# Patient Record
Sex: Male | Born: 1945
Health system: Southern US, Community
[De-identification: ages and names within clinical notes are randomized; demographics above are authoritative.]

## PROBLEM LIST (undated history)

## (undated) DIAGNOSIS — C439 Malignant melanoma of skin, unspecified: Secondary | ICD-10-CM

## (undated) DIAGNOSIS — C4491 Basal cell carcinoma of skin, unspecified: Secondary | ICD-10-CM

## (undated) DIAGNOSIS — T7840XA Allergy, unspecified, initial encounter: Secondary | ICD-10-CM

## (undated) DIAGNOSIS — E236 Other disorders of pituitary gland: Secondary | ICD-10-CM

## (undated) DIAGNOSIS — D126 Benign neoplasm of colon, unspecified: Secondary | ICD-10-CM

## (undated) DIAGNOSIS — M8580 Other specified disorders of bone density and structure, unspecified site: Secondary | ICD-10-CM

## (undated) DIAGNOSIS — E785 Hyperlipidemia, unspecified: Secondary | ICD-10-CM

## (undated) DIAGNOSIS — R002 Palpitations: Secondary | ICD-10-CM

## (undated) HISTORY — DX: Malignant melanoma of skin, unspecified: C43.9

## (undated) HISTORY — PX: POLYPECTOMY: SHX149

## (undated) HISTORY — DX: Allergy, unspecified, initial encounter: T78.40XA

## (undated) HISTORY — DX: Benign neoplasm of colon, unspecified: D12.6

## (undated) HISTORY — DX: Other specified disorders of bone density and structure, unspecified site: M85.80

## (undated) HISTORY — DX: Hyperlipidemia, unspecified: E78.5

## (undated) HISTORY — PX: COLONOSCOPY: SHX174

## (undated) HISTORY — DX: Other disorders of pituitary gland: E23.6

## (undated) HISTORY — DX: Palpitations: R00.2

## (undated) HISTORY — DX: Basal cell carcinoma of skin, unspecified: C44.91

---
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
## 1994-09-01 HISTORY — PX: MOHS SURGERY: SUR867

## 1994-09-01 HISTORY — PX: FACIAL RECONSTRUCTION SURGERY: SHX631

## 1998-09-01 DIAGNOSIS — M8580 Other specified disorders of bone density and structure, unspecified site: Secondary | ICD-10-CM

## 1998-09-01 HISTORY — DX: Other specified disorders of bone density and structure, unspecified site: M85.80

## 1999-07-28 ENCOUNTER — Emergency Department (HOSPITAL_COMMUNITY): Admission: EM | Admit: 1999-07-28 | Discharge: 1999-07-28 | Payer: Self-pay | Admitting: Emergency Medicine

## 2004-03-11 ENCOUNTER — Encounter: Payer: Self-pay | Admitting: Gastroenterology

## 2004-04-12 ENCOUNTER — Ambulatory Visit (HOSPITAL_COMMUNITY): Admission: RE | Admit: 2004-04-12 | Discharge: 2004-04-12 | Payer: Self-pay | Admitting: Gastroenterology

## 2005-02-19 ENCOUNTER — Emergency Department (HOSPITAL_COMMUNITY): Admission: EM | Admit: 2005-02-19 | Discharge: 2005-02-19 | Payer: Self-pay | Admitting: Emergency Medicine

## 2005-05-15 ENCOUNTER — Ambulatory Visit: Payer: Self-pay | Admitting: Gastroenterology

## 2006-07-14 ENCOUNTER — Ambulatory Visit: Payer: Self-pay | Admitting: Gastroenterology

## 2007-06-02 ENCOUNTER — Encounter: Admission: RE | Admit: 2007-06-02 | Discharge: 2007-06-02 | Payer: Self-pay | Admitting: Internal Medicine

## 2007-08-10 ENCOUNTER — Ambulatory Visit: Payer: Self-pay | Admitting: Gastroenterology

## 2008-01-06 ENCOUNTER — Ambulatory Visit: Payer: Self-pay | Admitting: Gastroenterology

## 2008-01-17 ENCOUNTER — Telehealth: Payer: Self-pay | Admitting: Gastroenterology

## 2008-02-17 ENCOUNTER — Encounter: Payer: Self-pay | Admitting: Gastroenterology

## 2008-03-31 ENCOUNTER — Telehealth: Payer: Self-pay | Admitting: Gastroenterology

## 2009-03-08 DIAGNOSIS — E782 Mixed hyperlipidemia: Secondary | ICD-10-CM | POA: Insufficient documentation

## 2009-03-08 DIAGNOSIS — K219 Gastro-esophageal reflux disease without esophagitis: Secondary | ICD-10-CM | POA: Insufficient documentation

## 2009-03-08 DIAGNOSIS — K648 Other hemorrhoids: Secondary | ICD-10-CM | POA: Insufficient documentation

## 2009-03-08 DIAGNOSIS — K222 Esophageal obstruction: Secondary | ICD-10-CM | POA: Insufficient documentation

## 2009-03-13 ENCOUNTER — Ambulatory Visit: Payer: Self-pay | Admitting: Gastroenterology

## 2009-03-15 LAB — CONVERTED CEMR LAB
Folate: >20 ng/mL
Iron: 102 ug/dL (ref 42–165)
Saturation Ratios: 28.2 % (ref 20.0–50.0)
Transferrin: 258.2 mg/dL (ref 212.0–360.0)

## 2009-03-19 ENCOUNTER — Telehealth: Payer: Self-pay | Admitting: Gastroenterology

## 2010-03-26 ENCOUNTER — Encounter: Admission: RE | Admit: 2010-03-26 | Discharge: 2010-03-26 | Payer: Self-pay | Admitting: Gastroenterology

## 2011-01-14 NOTE — Assessment & Plan Note (Signed)
[facility]                         GASTROENTEROLOGY OFFICE NOTE   DEMONTE, DOBRATZ                        MRN:          [id number]  DATE:01/06/2008                            DOB:          Sep 24, 1945    Paul King is doing well as long as he takes twice-a-day PPI.  He has  had a recurrence of his choking and globus sensation but denies true  dysphagia or true reflux symptoms.   Vital signs were all normal.  General physical exam was not repeated.   RECOMMENDATIONS:  I placed him on Kapidex 60 mg a day and we will see  how he does symptomatically on this medication, which should give him  longer, better acid control.  In addition, he is to continue his reflux  regime as previously outlined.     Paul Rea. Jarold Motto, MD, Caleen Essex, FAGA  Electronically Signed    DRP/MedQ  DD: 01/06/2008  DT: 01/06/2008  Job #: [id number]

## 2011-02-25 ENCOUNTER — Other Ambulatory Visit: Payer: Self-pay | Admitting: Internal Medicine

## 2011-02-25 DIAGNOSIS — N63 Unspecified lump in unspecified breast: Secondary | ICD-10-CM

## 2011-02-26 ENCOUNTER — Other Ambulatory Visit: Payer: Self-pay | Admitting: Internal Medicine

## 2011-02-26 ENCOUNTER — Ambulatory Visit
Admission: RE | Admit: 2011-02-26 | Discharge: 2011-02-26 | Disposition: A | Discharge status: Home / Self Care | Payer: BC Managed Care – PPO | Source: Ambulatory Visit | Attending: Internal Medicine | Admitting: Internal Medicine

## 2011-02-26 DIAGNOSIS — N63 Unspecified lump in unspecified breast: Secondary | ICD-10-CM

## 2011-04-23 HISTORY — PX: CARDIOVASCULAR STRESS TEST: SHX262

## 2011-09-09 DIAGNOSIS — L570 Actinic keratosis: Secondary | ICD-10-CM | POA: Diagnosis not present

## 2011-09-09 DIAGNOSIS — D239 Other benign neoplasm of skin, unspecified: Secondary | ICD-10-CM | POA: Diagnosis not present

## 2011-09-09 DIAGNOSIS — D485 Neoplasm of uncertain behavior of skin: Secondary | ICD-10-CM | POA: Diagnosis not present

## 2011-11-17 ENCOUNTER — Other Ambulatory Visit: Payer: Self-pay | Admitting: Internal Medicine

## 2011-11-17 DIAGNOSIS — M810 Age-related osteoporosis without current pathological fracture: Secondary | ICD-10-CM | POA: Diagnosis not present

## 2011-11-17 DIAGNOSIS — E229 Hyperfunction of pituitary gland, unspecified: Secondary | ICD-10-CM | POA: Diagnosis not present

## 2012-02-05 ENCOUNTER — Ambulatory Visit (INDEPENDENT_AMBULATORY_CARE_PROVIDER_SITE_OTHER): Payer: Medicare Other | Admitting: Internal Medicine

## 2012-02-05 VITALS — BP 125/73 | HR 71 | Temp 97.7°F | Resp 16 | Ht 71.5 in | Wt 165.8 lb

## 2012-02-05 DIAGNOSIS — L299 Pruritus, unspecified: Secondary | ICD-10-CM | POA: Diagnosis not present

## 2012-02-05 DIAGNOSIS — T148 Other injury of unspecified body region: Secondary | ICD-10-CM | POA: Diagnosis not present

## 2012-02-05 DIAGNOSIS — M810 Age-related osteoporosis without current pathological fracture: Secondary | ICD-10-CM | POA: Diagnosis not present

## 2012-02-05 DIAGNOSIS — W57XXXA Bitten or stung by nonvenomous insect and other nonvenomous arthropods, initial encounter: Secondary | ICD-10-CM

## 2012-02-05 MED ORDER — MUPIROCIN 2 % EX OINT: TOPICAL_OINTMENT | Freq: Three times a day (TID) | CUTANEOUS | Status: AC

## 2012-02-05 MED ORDER — CLOBETASOL PROPIONATE 0.05 % EX CREA: TOPICAL_CREAM | Freq: Two times a day (BID) | CUTANEOUS | Status: AC

## 2012-02-05 NOTE — Patient Instructions (Signed)
Spider Bite  Spider bites are not common. Most spider bites do not cause serious problems. The elderly, very young children, and people with certain existing medical conditions are more likely to experience significant symptoms.  SYMPTOMS   Spider bites may not cause any pain at first. Within 1 or 2 days of the bite, there may be swelling, redness, and pain in the bite area. However, some spider bites can cause pain within the first hour.  TREATMENT   Your caregiver may prescribe antibiotic medicine if a bacterial infection develops in the bite. However, not all spider bites require antibiotics or prescription medicines.   HOME CARE INSTRUCTIONS   Do not scratch the bite area.   Keep the bite area clean and dry. Wash the area with soap and water as directed.   Put ice or cool compresses on the bite area.   Put ice in a plastic bag.   Place a towel between your skin and the bag.   Leave the ice on for 20 minutes, 4 times a day for the first 2 to 3 days, or as directed.   Keep the bite area elevated above the level of your heart. This helps reduce redness and swelling.   Only take over-the-counter or prescription medicines as directed by your caregiver.   If you are given antibiotics, take them as directed. Finish them even if you start to feel better.  You may need a tetanus shot if:   You cannot remember when you had your last tetanus shot.   You have never had a tetanus shot.   The injury broke your skin.  If you get a tetanus shot, your arm may swell, get red, and feel warm to the touch. This is common and not a problem. If you need a tetanus shot and you choose not to have one, there is a rare chance of getting tetanus. Sickness from tetanus can be serious.  SEEK MEDICAL CARE IF:  Your bite is not better after 3 days of treatment.  SEEK IMMEDIATE MEDICAL CARE IF:   Your bite turns purple or develops increased swelling, pain, or redness.   You develop shortness of breath or chest pain.   You have  muscle cramps or painful muscle spasms.   You develop abdominal pain, nausea, or vomiting.   You feel unusually tired or sleepy.  MAKE SURE YOU:   Understand these instructions.   Will watch your condition.   Will get help right away if you are not doing well or get worse.  Document Released: 09/25/2004 Document Revised: 08/07/2011 Document Reviewed: 03/19/2011  Exit[employer] Patient Information 2012 [employer].

## 2012-02-07 NOTE — Progress Notes (Signed)
  Subjective:    Patient ID: Paul King, male    DOB: 1946-03-08, 66 y.o.   MRN: [id number]  HPI    Review of Systems     Objective:   Physical Exam        Assessment & Plan:  Insect bite

## 2012-04-06 ENCOUNTER — Ambulatory Visit
Admission: RE | Admit: 2012-04-06 | Discharge: 2012-04-06 | Disposition: A | Discharge status: Home / Self Care | Payer: Medicare Other | Source: Ambulatory Visit | Attending: Internal Medicine | Admitting: Internal Medicine

## 2012-04-06 DIAGNOSIS — M810 Age-related osteoporosis without current pathological fracture: Secondary | ICD-10-CM | POA: Diagnosis not present

## 2012-04-06 DIAGNOSIS — N401 Enlarged prostate with lower urinary tract symptoms: Secondary | ICD-10-CM | POA: Diagnosis not present

## 2012-04-06 LAB — HM DEXA SCAN

## 2012-04-12 DIAGNOSIS — N401 Enlarged prostate with lower urinary tract symptoms: Secondary | ICD-10-CM | POA: Diagnosis not present

## 2012-05-06 ENCOUNTER — Telehealth: Payer: Self-pay

## 2012-05-06 DIAGNOSIS — Z85820 Personal history of malignant melanoma of skin: Secondary | ICD-10-CM | POA: Diagnosis not present

## 2012-05-06 DIAGNOSIS — L570 Actinic keratosis: Secondary | ICD-10-CM | POA: Diagnosis not present

## 2012-05-06 DIAGNOSIS — I059 Rheumatic mitral valve disease, unspecified: Secondary | ICD-10-CM | POA: Diagnosis not present

## 2012-05-06 DIAGNOSIS — Z85828 Personal history of other malignant neoplasm of skin: Secondary | ICD-10-CM | POA: Diagnosis not present

## 2012-05-06 DIAGNOSIS — L821 Other seborrheic keratosis: Secondary | ICD-10-CM | POA: Diagnosis not present

## 2012-05-06 NOTE — Telephone Encounter (Signed)
Patient called back after checking his records. No recent labs. Will come in for a physical and lab work.

## 2012-05-06 NOTE — Telephone Encounter (Signed)
The patient called to request that his lab work from 2011 and 2012 (that show his cholesterol levels) be faxed to [facility] on [facility], to the attention of  Dr. Royann Shivers.  The patient may be reached at [phone].

## 2012-05-06 NOTE — Telephone Encounter (Signed)
Spoke with patient. Last labs with cholesterol levels were from 2009 and 2010. He states [facility] already has those and he will check his records for more recent labs.

## 2012-05-07 ENCOUNTER — Encounter: Payer: Self-pay | Admitting: Internal Medicine

## 2012-05-17 ENCOUNTER — Ambulatory Visit: Payer: Medicare Other

## 2012-05-17 ENCOUNTER — Ambulatory Visit (INDEPENDENT_AMBULATORY_CARE_PROVIDER_SITE_OTHER): Payer: Medicare Other | Admitting: Internal Medicine

## 2012-05-17 VITALS — BP 120/70 | HR 67 | Temp 98.2°F | Resp 18 | Ht 71.0 in | Wt 165.8 lb

## 2012-05-17 DIAGNOSIS — E221 Hyperprolactinemia: Secondary | ICD-10-CM | POA: Insufficient documentation

## 2012-05-17 DIAGNOSIS — M810 Age-related osteoporosis without current pathological fracture: Secondary | ICD-10-CM

## 2012-05-17 DIAGNOSIS — Z79899 Other long term (current) drug therapy: Secondary | ICD-10-CM

## 2012-05-17 DIAGNOSIS — Z0000 Encounter for general adult medical examination without abnormal findings: Secondary | ICD-10-CM | POA: Diagnosis not present

## 2012-05-17 DIAGNOSIS — M25529 Pain in unspecified elbow: Secondary | ICD-10-CM

## 2012-05-17 DIAGNOSIS — E7800 Pure hypercholesterolemia, unspecified: Secondary | ICD-10-CM

## 2012-05-17 DIAGNOSIS — E229 Hyperfunction of pituitary gland, unspecified: Secondary | ICD-10-CM | POA: Diagnosis not present

## 2012-05-17 LAB — LIPID PANEL
Cholesterol: 238 mg/dL — ABNORMAL HIGH (ref 0–200)
HDL: 39 mg/dL — ABNORMAL LOW (ref >39)
Total CHOL/HDL Ratio: 6.1 Ratio
VLDL: 71 mg/dL — ABNORMAL HIGH (ref 0–40)

## 2012-05-17 LAB — CBC WITH DIFFERENTIAL/PLATELET
Eosinophils Relative: 4 % (ref 0–5)
HCT: 41.9 % (ref 39.0–52.0)
Hemoglobin: 13.9 g/dL (ref 13.0–17.0)
MCH: 28.1 pg (ref 26.0–34.0)
MCHC: 33.2 g/dL (ref 30.0–36.0)
Platelets: 299 10*3/uL (ref 150–400)
RBC: 4.94 MIL/uL (ref 4.22–5.81)
RDW: 14 % (ref 11.5–15.5)

## 2012-05-17 LAB — COMPREHENSIVE METABOLIC PANEL
ALT: 13 U/L (ref 0–53)
CO2: 29 mEq/L (ref 19–32)
Creat: 0.77 mg/dL (ref 0.50–1.35)
Glucose, Bld: 92 mg/dL (ref 70–99)
Total Bilirubin: 0.4 mg/dL (ref 0.3–1.2)

## 2012-05-17 NOTE — Progress Notes (Signed)
  Subjective:    Patient ID: Paul King, male    DOB: 19-Dec-1945, 66 y.o.   MRN: [id number]  HPIHere for annual exam Doing very well Feels so much better on medication for hyperprolactinemia/recent DEXA scan actually showed improvement in bone density   Pain in R elbow with full pronat for 6 months/ok with golf,grip,eating etc Review of Systems 13 point review of systems negative other than above Dr Coituru-Cards Val last month within normal limits   C. Gastroenterology evaluation-all symptoms abated Urology evaluation last month, PSA less than 1 all symptoms normal Followed by endocrinology at [facility] for prolactinemia and doing well Objective:   Physical Exam Vital signs stable HEENT clear Heart regular without murmur Lungs clear Abdomen supple Extremities= good range of motion bowel major joints The right elbow is nontender over the lateral epicondyles but just medial to this as you move into the antecubital fossa is a tender area with the pressure/this can be aggravated by extreme pronation Neurological intact    [facility] reading (PRIMARY) by  Dr.Caylen Kuwahara=no bony abnormality   Assessment & Plan:  Annual examination-very healthy 66 year old 1. Pain in elbow  DG Elbow Complete Right  2. Annual physical exam  CBC with Differential, Comprehensive metabolic panel, Lipid panel  3. Hyperprolactinemia  Comprehensive metabolic panel, Lipid panel  4. Hx of hypercholesterolemia  Lipid panel  5. Medication management -cabergoline CBC with Differential, Comprehensive metabolic panel  6. Osteoporosis Secondary    Routine labs to share with cardiology

## 2012-05-19 ENCOUNTER — Encounter: Payer: Self-pay | Admitting: Internal Medicine

## 2012-05-27 ENCOUNTER — Encounter: Payer: Self-pay | Admitting: *Deleted

## 2012-05-27 DIAGNOSIS — R002 Palpitations: Secondary | ICD-10-CM

## 2012-05-31 ENCOUNTER — Telehealth: Payer: Self-pay

## 2012-05-31 NOTE — Telephone Encounter (Signed)
Never heard results of his arm xray would like a call back it has been 2 weeks [phone]

## 2012-05-31 NOTE — Telephone Encounter (Signed)
Called and left message to advise of xray result. Advised if still painful to return to clinic.

## 2012-06-22 ENCOUNTER — Encounter: Payer: Self-pay | Admitting: Family Medicine

## 2012-06-22 DIAGNOSIS — E229 Hyperfunction of pituitary gland, unspecified: Secondary | ICD-10-CM | POA: Diagnosis not present

## 2012-06-23 ENCOUNTER — Encounter: Payer: Self-pay | Admitting: *Deleted

## 2012-07-05 DIAGNOSIS — I059 Rheumatic mitral valve disease, unspecified: Secondary | ICD-10-CM | POA: Diagnosis not present

## 2012-07-05 HISTORY — PX: US ECHOCARDIOGRAPHY: HXRAD669

## 2012-08-03 ENCOUNTER — Telehealth: Payer: Self-pay

## 2012-08-03 NOTE — Telephone Encounter (Signed)
Pt would like a copy of his x-rays from a month or two ago. Best# [phone]

## 2012-08-03 NOTE — Telephone Encounter (Signed)
Request given to xray 

## 2012-08-05 ENCOUNTER — Telehealth: Payer: Self-pay

## 2012-08-05 NOTE — Telephone Encounter (Signed)
Pt came into walk in clinic to p/u. Xray was not in drawer so Cipriano Mile. Made copy. Gave to pt.  bf

## 2012-08-05 NOTE — Telephone Encounter (Signed)
Pt would like to pick xray up tonight for his appt on Monday he is going out of town please call when ready

## 2012-08-09 DIAGNOSIS — M7710 Lateral epicondylitis, unspecified elbow: Secondary | ICD-10-CM | POA: Diagnosis not present

## 2012-10-07 ENCOUNTER — Encounter: Payer: Self-pay | Admitting: *Deleted

## 2012-10-07 DIAGNOSIS — I341 Nonrheumatic mitral (valve) prolapse: Secondary | ICD-10-CM | POA: Insufficient documentation

## 2012-11-08 DIAGNOSIS — L819 Disorder of pigmentation, unspecified: Secondary | ICD-10-CM | POA: Diagnosis not present

## 2012-11-08 DIAGNOSIS — D485 Neoplasm of uncertain behavior of skin: Secondary | ICD-10-CM | POA: Diagnosis not present

## 2012-11-08 DIAGNOSIS — D1801 Hemangioma of skin and subcutaneous tissue: Secondary | ICD-10-CM | POA: Diagnosis not present

## 2012-11-08 DIAGNOSIS — L821 Other seborrheic keratosis: Secondary | ICD-10-CM | POA: Diagnosis not present

## 2012-11-08 DIAGNOSIS — L570 Actinic keratosis: Secondary | ICD-10-CM | POA: Diagnosis not present

## 2012-11-08 DIAGNOSIS — L851 Acquired keratosis [keratoderma] palmaris et plantaris: Secondary | ICD-10-CM | POA: Diagnosis not present

## 2012-11-08 DIAGNOSIS — Z85828 Personal history of other malignant neoplasm of skin: Secondary | ICD-10-CM | POA: Diagnosis not present

## 2012-12-08 DIAGNOSIS — H5200 Hypermetropia, unspecified eye: Secondary | ICD-10-CM | POA: Diagnosis not present

## 2012-12-08 DIAGNOSIS — H524 Presbyopia: Secondary | ICD-10-CM | POA: Diagnosis not present

## 2012-12-08 DIAGNOSIS — H179 Unspecified corneal scar and opacity: Secondary | ICD-10-CM | POA: Diagnosis not present

## 2013-04-22 ENCOUNTER — Telehealth: Payer: Self-pay | Admitting: Cardiovascular Disease

## 2013-04-22 DIAGNOSIS — Z79899 Other long term (current) drug therapy: Secondary | ICD-10-CM

## 2013-04-22 DIAGNOSIS — R5381 Other malaise: Secondary | ICD-10-CM

## 2013-04-22 DIAGNOSIS — E782 Mixed hyperlipidemia: Secondary | ICD-10-CM

## 2013-04-22 NOTE — Telephone Encounter (Signed)
Please send a lab order out to Paul King before his yearly appointment on 05/26/13 at 8am   Thanks

## 2013-04-22 NOTE — Telephone Encounter (Signed)
Message forwarded to W. Waddell, CMA.  

## 2013-04-22 NOTE — Telephone Encounter (Signed)
Correction. Pt is Dr. Royann Shivers patient.  Forwarded to B. Lassiter, CMA.  Chart# [id number] on desk.

## 2013-04-22 NOTE — Telephone Encounter (Signed)
This is not my patient. Won't know what to order for the MD.

## 2013-04-25 NOTE — Telephone Encounter (Signed)
Lab order sent to patient  

## 2013-05-16 DIAGNOSIS — Z85828 Personal history of other malignant neoplasm of skin: Secondary | ICD-10-CM | POA: Diagnosis not present

## 2013-05-16 DIAGNOSIS — L821 Other seborrheic keratosis: Secondary | ICD-10-CM | POA: Diagnosis not present

## 2013-05-16 DIAGNOSIS — D1801 Hemangioma of skin and subcutaneous tissue: Secondary | ICD-10-CM | POA: Diagnosis not present

## 2013-05-16 DIAGNOSIS — Z85820 Personal history of malignant melanoma of skin: Secondary | ICD-10-CM | POA: Diagnosis not present

## 2013-05-16 DIAGNOSIS — L738 Other specified follicular disorders: Secondary | ICD-10-CM | POA: Diagnosis not present

## 2013-05-16 DIAGNOSIS — D239 Other benign neoplasm of skin, unspecified: Secondary | ICD-10-CM | POA: Diagnosis not present

## 2013-05-16 DIAGNOSIS — L819 Disorder of pigmentation, unspecified: Secondary | ICD-10-CM | POA: Diagnosis not present

## 2013-05-16 DIAGNOSIS — L570 Actinic keratosis: Secondary | ICD-10-CM | POA: Diagnosis not present

## 2013-05-17 ENCOUNTER — Encounter: Payer: Self-pay | Admitting: *Deleted

## 2013-05-17 DIAGNOSIS — E782 Mixed hyperlipidemia: Secondary | ICD-10-CM | POA: Diagnosis not present

## 2013-05-17 DIAGNOSIS — R5381 Other malaise: Secondary | ICD-10-CM | POA: Diagnosis not present

## 2013-05-17 DIAGNOSIS — Z79899 Other long term (current) drug therapy: Secondary | ICD-10-CM | POA: Diagnosis not present

## 2013-05-17 LAB — LIPID PANEL
LDL Cholesterol: 151 mg/dL — ABNORMAL HIGH (ref 0–99)
Total CHOL/HDL Ratio: 5.2 Ratio
VLDL: 36 mg/dL (ref 0–40)

## 2013-05-17 LAB — CBC
HCT: 42.5 % (ref 39.0–52.0)
MCH: 28.1 pg (ref 26.0–34.0)
MCV: 83.7 fL (ref 78.0–100.0)
RBC: 5.08 MIL/uL (ref 4.22–5.81)
RDW: 14.3 % (ref 11.5–15.5)
WBC: 6.5 10*3/uL (ref 4.0–10.5)

## 2013-05-17 LAB — TSH: TSH: 2.273 u[IU]/mL (ref 0.350–4.500)

## 2013-05-17 LAB — COMPREHENSIVE METABOLIC PANEL
Albumin: 4 g/dL (ref 3.5–5.2)
CO2: 30 mEq/L (ref 19–32)
Calcium: 9.4 mg/dL (ref 8.4–10.5)
Potassium: 4.3 mEq/L (ref 3.5–5.3)
Sodium: 139 mEq/L (ref 135–145)
Total Bilirubin: 0.9 mg/dL (ref 0.3–1.2)

## 2013-05-24 ENCOUNTER — Encounter: Payer: Self-pay | Admitting: Cardiovascular Disease

## 2013-05-26 ENCOUNTER — Ambulatory Visit (INDEPENDENT_AMBULATORY_CARE_PROVIDER_SITE_OTHER): Payer: Medicare Other | Admitting: Cardiovascular Disease

## 2013-05-26 ENCOUNTER — Encounter: Payer: Self-pay | Admitting: Cardiovascular Disease

## 2013-05-26 VITALS — BP 140/78 | HR 71 | Resp 20 | Ht 72.0 in | Wt 170.3 lb

## 2013-05-26 DIAGNOSIS — E7800 Pure hypercholesterolemia, unspecified: Secondary | ICD-10-CM

## 2013-05-26 DIAGNOSIS — R002 Palpitations: Secondary | ICD-10-CM

## 2013-05-26 DIAGNOSIS — I059 Rheumatic mitral valve disease, unspecified: Secondary | ICD-10-CM | POA: Diagnosis not present

## 2013-05-26 DIAGNOSIS — E782 Mixed hyperlipidemia: Secondary | ICD-10-CM

## 2013-05-26 DIAGNOSIS — I341 Nonrheumatic mitral (valve) prolapse: Secondary | ICD-10-CM

## 2013-05-26 NOTE — Assessment & Plan Note (Signed)
No clinical evidence of significant mitral insufficiency. Most recent echo showed only mild regurgitation. Unless he develops symptoms of shortness of breath or change in physical exam, echocardiography is only indicated maybe once every 5 years.

## 2013-05-26 NOTE — Assessment & Plan Note (Signed)
His lipid profile shows marked improvement in triglyceride and HDL-C levels. The total cholesterol is still high and unchanged. There is an apparent increase in LDL-C levels, but I believe this is artifactual. The calculated LDL level appears higher, secondary to the drop in triglycerides. While the LDL level of 150 is undesirable, it is still not in the range where pharmacological therapy is justified. I think it is important that he continue with efforts to reduce saturated fat in his diet, stay lean and exercise. At his next appointment I think we should check a NMR lipid profile with a directly measured LDL cholesterol and breakdown of the types of LDL particles. My suspicion is that he may  have a favorable LDL distribution, not withstanding the the elevated triglycerides. He makes it clear again today that he strongly prefers not to take any lipid-lowering pharmaceuticals, and he appears to have some prejudice against statins.

## 2013-05-26 NOTE — Assessment & Plan Note (Signed)
His ecg today shows a single isolated PVC. He is minimally symptomatic from palpitations. No medicines are indicated.

## 2013-05-26 NOTE — Patient Instructions (Addendum)
Have FASTING lab work done prior to your next appointment in one year at [facility] on the first floor.  Your physician recommends that you schedule a follow-up appointment in: ONE YEAR

## 2013-05-26 NOTE — Progress Notes (Signed)
Patient ID: Paul King, male   DOB: July 18, 1946, 67 y.o.   MRN: [id number]     Reason for office visit Mitral valve prolapse, hyperlipidemia, palpitations  Paul King returns for routine followup and feels well. He has just returned from a golfing trip to [place] where he exercised a lot. He played 9 courses of golf and 10 days and on several days played a total of 36 holes.  He never used a cart. He denied any problems with shortness of breath, fatigue or chest discomfort.  Continues to have occasional palpitations, only noticeable when he lies down at night.  His lipid profile shows substantial improvement in triglyceride levels and an improvement in HDL cholesterol. His total cholesterol remains elevated. He generally eats a healthy diet although he does have a passion for cheese which is the major source of saturated fat intake.     Allergies  Allergen Reactions  . Sulfonamide Derivatives     Current Outpatient Prescriptions  Medication Sig Dispense Refill  . aspirin 81 MG tablet Take 81 mg by mouth daily.      . cabergoline (DOSTINEX) 0.5 MG tablet Take 0.25 mg by mouth 2 (two) times a week.      . calcium carbonate 200 MG capsule Take 1,000 mg by mouth daily.      . cholecalciferol (VITAMIN D) 1000 UNITS tablet Take 2,000 Units by mouth daily.       . hydrocortisone butyrate (LUCOID) 0.1 % CREA cream Apply topically 2 (two) times daily as needed.       . Multiple Vitamins-Minerals (MULTIVITAMIN WITH MINERALS) tablet Take 1 tablet by mouth daily.       No current facility-administered medications for this visit.    Past Medical History  Diagnosis Date  . Osteoporosis   . Palpitations   . Hyperlipidemia   . Hypoprolactinemia     Past Surgical History  Procedure Laterality Date  . Tonsillectomy  1951  . Mohs surgery  1996  . Facial reconstruction surgery  1996  . US echocardiography  07/05/2012    mild-mod LA dilation,mild mitral insuff,mild AI  . Cardiovascular  stress test  04/23/2011    negative    Family History  Problem Relation Age of Onset  . Heart failure Mother   . Stroke Mother   . Stroke Father     History   Social History  . Marital Status: Married    Spouse Name: N/A    Number of Children: N/A  . Years of Education: N/A   Occupational History  . Not on file.   Social History Main Topics  . Smoking status: Never Smoker   . Smokeless tobacco: Not on file  . Alcohol Use: Yes     Comment: 4 glasses of wine a week  . Drug Use: No  . Sexual Activity: Not on file   Other Topics Concern  . Not on file   Social History Narrative  . No narrative on file    Review of systems: The patient specifically denies any chest pain at rest or with exertion, dyspnea at rest or with exertion, orthopnea, paroxysmal nocturnal dyspnea, syncope, focal neurological deficits, intermittent claudication, lower extremity edema, unexplained weight gain, cough, hemoptysis or wheezing.  The patient also denies abdominal pain, nausea, vomiting, dysphagia, diarrhea, constipation, polyuria, polydipsia, dysuria, hematuria, frequency, urgency, abnormal bleeding or bruising, fever, chills, unexpected weight changes, mood swings, change in skin or hair texture, change in voice quality, auditory or visual problems, allergic  reactions or rashes, new musculoskeletal complaints other than usual "aches and pains".   PHYSICAL EXAM BP 140/78  Pulse 71  Resp 20  Ht 6' (1.829 m)  Wt 170 lb 4.8 oz (77.248 kg)  BMI 23.09 kg/m2  General: Alert, oriented x3, no distress Head: no evidence of trauma, PERRL, EOMI, no exophtalmos or lid lag, no myxedema, no xanthelasma; normal ears, nose and oropharynx Neck: normal jugular venous pulsations and no hepatojugular reflux; brisk carotid pulses without delay and no carotid bruits Chest: clear to auscultation, no signs of consolidation by percussion or palpation, normal fremitus, symmetrical and full respiratory  excursions Cardiovascular: normal position and quality of the apical impulse, regular rhythm, normal first and second heart sounds, no murmurs, rubs or gallops. He does have a faint apical click that becomes a little more obvious with a Valsalva maneuver, but there is no associated murmur Abdomen: no tenderness or distention, no masses by palpation, no abnormal pulsatility or arterial bruits, normal bowel sounds, no hepatosplenomegaly Extremities: no clubbing, cyanosis or edema; 2+ radial, ulnar and brachial pulses bilaterally; 2+ right femoral, posterior tibial and dorsalis pedis pulses; 2+ left femoral, posterior tibial and dorsalis pedis pulses; no subclavian or femoral bruits Neurological: grossly nonfocal   EKG: Sinus rhythm, a single PVC, otherwise normal  Lipid Panel     Component Value Date/Time   CHOL 232* 05/17/2013 0840   TRIG 178* 05/17/2013 0840   HDL 45 05/17/2013 0840   CHOLHDL 5.2 05/17/2013 0840   VLDL 36 05/17/2013 0840   LDLCALC 151* 05/17/2013 0840    BMET    Component Value Date/Time   NA 139 05/17/2013 0840   K 4.3 05/17/2013 0840   CL 101 05/17/2013 0840   CO2 30 05/17/2013 0840   GLUCOSE 87 05/17/2013 0840   BUN 16 05/17/2013 0840   CREATININE 0.86 05/17/2013 0840   CALCIUM 9.4 05/17/2013 0840     ASSESSMENT AND PLAN HYPERCHOLESTEROLEMIA His lipid profile shows marked improvement in triglyceride and HDL-C levels. The total cholesterol is still high and unchanged. There is an apparent increase in LDL-C levels, but I believe this is artifactual. The calculated LDL level appears higher, secondary to the drop in triglycerides. While the LDL level of 150 is undesirable, it is still not in the range where pharmacological therapy is justified. I think it is important that he continue with efforts to reduce saturated fat in his diet, stay lean and exercise. At his next appointment I think we should check a NMR lipid profile with a directly measured LDL cholesterol and breakdown  of the types of LDL particles. My suspicion is that he may  have a favorable LDL distribution, not withstanding the the elevated triglycerides. He makes it clear again today that he strongly prefers not to take any lipid-lowering pharmaceuticals, and he appears to have some prejudice against statins.  Palpitations His ecg today shows a single isolated PVC. He is minimally symptomatic from palpitations. No medicines are indicated.  MVP (mitral valve prolapse) No clinical evidence of significant mitral insufficiency. Most recent echo showed only mild regurgitation. Unless he develops symptoms of shortness of breath or change in physical exam, echocardiography is only indicated maybe once every 5 years.  Orders Placed This Encounter  Procedures  . NMR Lipoprofile with Lipids  . EKG 12-Lead   No orders of the defined types were placed in this encounter.    Junious Silk, MD, [facility] [phone] office [phone] pager

## 2013-06-23 DIAGNOSIS — M899 Disorder of bone, unspecified: Secondary | ICD-10-CM | POA: Diagnosis not present

## 2013-06-23 DIAGNOSIS — E229 Hyperfunction of pituitary gland, unspecified: Secondary | ICD-10-CM | POA: Diagnosis not present

## 2013-06-23 DIAGNOSIS — Z23 Encounter for immunization: Secondary | ICD-10-CM | POA: Diagnosis not present

## 2013-06-23 DIAGNOSIS — E348 Other specified endocrine disorders: Secondary | ICD-10-CM | POA: Diagnosis not present

## 2013-11-14 DIAGNOSIS — D1801 Hemangioma of skin and subcutaneous tissue: Secondary | ICD-10-CM | POA: Diagnosis not present

## 2013-11-14 DIAGNOSIS — L219 Seborrheic dermatitis, unspecified: Secondary | ICD-10-CM | POA: Diagnosis not present

## 2013-11-14 DIAGNOSIS — Z85828 Personal history of other malignant neoplasm of skin: Secondary | ICD-10-CM | POA: Diagnosis not present

## 2013-11-14 DIAGNOSIS — L821 Other seborrheic keratosis: Secondary | ICD-10-CM | POA: Diagnosis not present

## 2013-11-14 DIAGNOSIS — D239 Other benign neoplasm of skin, unspecified: Secondary | ICD-10-CM | POA: Diagnosis not present

## 2013-11-14 DIAGNOSIS — L570 Actinic keratosis: Secondary | ICD-10-CM | POA: Diagnosis not present

## 2014-04-03 DIAGNOSIS — N401 Enlarged prostate with lower urinary tract symptoms: Secondary | ICD-10-CM | POA: Diagnosis not present

## 2014-04-03 DIAGNOSIS — N139 Obstructive and reflux uropathy, unspecified: Secondary | ICD-10-CM | POA: Diagnosis not present

## 2014-04-03 DIAGNOSIS — Z125 Encounter for screening for malignant neoplasm of prostate: Secondary | ICD-10-CM | POA: Diagnosis not present

## 2014-04-03 DIAGNOSIS — N138 Other obstructive and reflux uropathy: Secondary | ICD-10-CM | POA: Diagnosis not present

## 2014-04-13 DIAGNOSIS — D126 Benign neoplasm of colon, unspecified: Secondary | ICD-10-CM | POA: Diagnosis not present

## 2014-04-13 DIAGNOSIS — K620 Anal polyp: Secondary | ICD-10-CM | POA: Diagnosis not present

## 2014-04-13 DIAGNOSIS — D181 Lymphangioma, any site: Secondary | ICD-10-CM | POA: Diagnosis not present

## 2014-04-13 DIAGNOSIS — Z86010 Personal history of colonic polyps: Secondary | ICD-10-CM | POA: Diagnosis not present

## 2014-05-17 ENCOUNTER — Ambulatory Visit (INDEPENDENT_AMBULATORY_CARE_PROVIDER_SITE_OTHER): Payer: Medicare Other | Admitting: Cardiovascular Disease

## 2014-05-17 ENCOUNTER — Encounter: Payer: Self-pay | Admitting: Cardiovascular Disease

## 2014-05-17 VITALS — BP 104/70 | HR 75 | Resp 16 | Ht 72.0 in | Wt 168.8 lb

## 2014-05-17 DIAGNOSIS — E7800 Pure hypercholesterolemia, unspecified: Secondary | ICD-10-CM

## 2014-05-17 DIAGNOSIS — R002 Palpitations: Secondary | ICD-10-CM | POA: Diagnosis not present

## 2014-05-17 NOTE — Patient Instructions (Signed)
Your physician recommends that you schedule a follow-up appointment in: 1 Year  

## 2014-05-22 ENCOUNTER — Encounter: Payer: Self-pay | Admitting: Cardiovascular Disease

## 2014-05-22 NOTE — Progress Notes (Signed)
Patient ID: RANDE ROYLANCE, male   DOB: Aug 23, 1946, 68 y.o.   MRN: [id number]     Reason for office visit Palpitations, possible mitral valve prolapse, hyperlipidemia  Mr. Sar has had a good year. His palpitations rarely bother him. He only notices them when he lies in bed at night. He has not had any sustained racing heartbeat. He has a physical exam that is strongly suggestive of mitral valve prolapse with a loud systolic click that varies with a Valsalva maneuver but does not have an associated murmur. By echocardiography there is no convincing evidence of mitral valve prolapse. He has mild dyslipidemia with elevated triglycerides and LDL cholesterol despite being very active and very lean. He has no evidence of coronary or other vascular disease.  He continues to play golf most days of the week, often playing 36 holes. He never arrived with a heart. He has no other cardiovascular complaints.  His palpitations are probably related to PVCs, occasionally seen on his electrocardiograms. He prefers no medications for these.   Allergies  Allergen Reactions  . Sulfonamide Derivatives     Current Outpatient Prescriptions  Medication Sig Dispense Refill  . aspirin 81 MG tablet Take 81 mg by mouth daily.      . cabergoline (DOSTINEX) 0.5 MG tablet Take 0.25 mg by mouth 2 (two) times a week.      Marland Kitchen CALCIUM & MAGNESIUM CARBONATES PO Take 1,000 mg by mouth daily.      . Calcium-Magnesium-Zinc (V-R CALCIUM-MAGNESIUM-ZINC) 333-133-5 MG TABS Take by mouth.      . cholecalciferol (VITAMIN D) 1000 UNITS tablet Take 2,000 Units by mouth daily.       . hydrocortisone butyrate (LUCOID) 0.1 % CREA cream Apply topically 2 (two) times daily as needed.       . Multiple Vitamins-Minerals (MULTIVITAMIN WITH MINERALS) tablet Take 1 tablet by mouth daily.       No current facility-administered medications for this visit.    Past Medical History  Diagnosis Date  . Osteoporosis   . Palpitations   .  Hyperlipidemia   . Hypoprolactinemia     Past Surgical History  Procedure Laterality Date  . Tonsillectomy  1951  . Mohs surgery  1996  . Facial reconstruction surgery  1996  . US echocardiography  07/05/2012    mild-mod LA dilation,mild mitral insuff,mild AI  . Cardiovascular stress test  04/23/2011    negative    Family History  Problem Relation Age of Onset  . Heart failure Mother   . Stroke Mother   . Stroke Father     History   Social History  . Marital Status: Married    Spouse Name: N/A    Number of Children: N/A  . Years of Education: N/A   Occupational History  . Not on file.   Social History Main Topics  . Smoking status: Never Smoker   . Smokeless tobacco: Not on file  . Alcohol Use: Yes     Comment: 4 glasses of wine a week  . Drug Use: No  . Sexual Activity: Not on file   Other Topics Concern  . Not on file   Social History Narrative  . No narrative on file    Review of systems: The patient specifically denies any chest pain at rest or with exertion, dyspnea at rest or with exertion, orthopnea, paroxysmal nocturnal dyspnea, syncope focal neurological deficits, intermittent claudication, lower extremity edema, unexplained weight gain, cough, hemoptysis or wheezing.  The patient  also denies abdominal pain, nausea, vomiting, dysphagia, diarrhea, constipation, polyuria, polydipsia, dysuria, hematuria, frequency, urgency, abnormal bleeding or bruising, fever, chills, unexpected weight changes, mood swings, change in skin or hair texture, change in voice quality, auditory or visual problems, allergic reactions or rashes, new musculoskeletal complaints other than usual "aches and pains".   PHYSICAL EXAM BP 104/70  Pulse 75  Resp 16  Ht 6' (1.829 m)  Wt 76.567 kg (168 lb 12.8 oz)  BMI 22.89 kg/m2 General: Alert, oriented x3, no distress  Head: no evidence of trauma, PERRL, EOMI, no exophtalmos or lid lag, no myxedema, no xanthelasma; normal ears, nose  and oropharynx  Neck: normal jugular venous pulsations and no hepatojugular reflux; brisk carotid pulses without delay and no carotid bruits  Chest: clear to auscultation, no signs of consolidation by percussion or palpation, normal fremitus, symmetrical and full respiratory excursions  Cardiovascular: normal position and quality of the apical impulse, regular rhythm, normal first and second heart sounds, no murmurs, rubs or gallops. He does have a faint apical click that becomes a little more obvious with a Valsalva maneuver, but there is no associated murmur  Abdomen: no tenderness or distention, no masses by palpation, no abnormal pulsatility or arterial bruits, normal bowel sounds, no hepatosplenomegaly  Extremities: no clubbing, cyanosis or edema; 2+ radial, ulnar and brachial pulses bilaterally; 2+ right femoral, posterior tibial and dorsalis pedis pulses; 2+ left femoral, posterior tibial and dorsalis pedis pulses; no subclavian or femoral bruits  Neurological: grossly nonfocal  EKG: NSR  Lipid Panel     Component Value Date/Time   CHOL 232* 05/17/2013 0840   TRIG 178* 05/17/2013 0840   HDL 45 05/17/2013 0840   CHOLHDL 5.2 05/17/2013 0840   VLDL 36 05/17/2013 0840   LDLCALC 151* 05/17/2013 0840    BMET    Component Value Date/Time   NA 139 05/17/2013 0840   K 4.3 05/17/2013 0840   CL 101 05/17/2013 0840   CO2 30 05/17/2013 0840   GLUCOSE 87 05/17/2013 0840   BUN 16 05/17/2013 0840   CREATININE 0.86 05/17/2013 0840   CALCIUM 9.4 05/17/2013 0840     ASSESSMENT AND PLAN  Despite still having palpitations, Mr. Lingenfelter prefers not to take medications for this. He is very active and has excellent quality of life without any worrisome findings on previous cardiac evaluation. We discussed the implications of a possible diagnosis of mitral prolapse, which at this point probably has no impact on his prognosis. He is encouraged to continue to stay physically active and eat a healthy diet. He will  soon have a repeat lipid profile with his primary care physician.  Orders Placed This Encounter  Procedures  . EKG 12-Lead   Meds ordered this encounter  Medications  . Calcium-Magnesium-Zinc (V-R CALCIUM-MAGNESIUM-ZINC) 333-133-5 MG TABS    Sig: Take by mouth.  Marland Kitchen CALCIUM & MAGNESIUM CARBONATES PO    Sig: Take 1,000 mg by mouth daily.    Holli Humbles, MD, [place] [phone] office [phone] pager

## 2014-07-03 DIAGNOSIS — L570 Actinic keratosis: Secondary | ICD-10-CM | POA: Diagnosis not present

## 2014-07-03 DIAGNOSIS — L812 Freckles: Secondary | ICD-10-CM | POA: Diagnosis not present

## 2014-07-03 DIAGNOSIS — D225 Melanocytic nevi of trunk: Secondary | ICD-10-CM | POA: Diagnosis not present

## 2014-07-03 DIAGNOSIS — D1801 Hemangioma of skin and subcutaneous tissue: Secondary | ICD-10-CM | POA: Diagnosis not present

## 2014-07-03 DIAGNOSIS — Z85828 Personal history of other malignant neoplasm of skin: Secondary | ICD-10-CM | POA: Diagnosis not present

## 2014-07-03 DIAGNOSIS — L821 Other seborrheic keratosis: Secondary | ICD-10-CM | POA: Diagnosis not present

## 2014-07-03 DIAGNOSIS — Z85820 Personal history of malignant melanoma of skin: Secondary | ICD-10-CM | POA: Diagnosis not present

## 2014-07-03 DIAGNOSIS — L578 Other skin changes due to chronic exposure to nonionizing radiation: Secondary | ICD-10-CM | POA: Diagnosis not present

## 2014-07-31 ENCOUNTER — Ambulatory Visit (INDEPENDENT_AMBULATORY_CARE_PROVIDER_SITE_OTHER): Payer: Medicare Other | Admitting: Internal Medicine

## 2014-07-31 VITALS — BP 120/70 | HR 84 | Temp 98.5°F | Resp 16 | Ht 72.0 in | Wt 167.0 lb

## 2014-07-31 DIAGNOSIS — Z130 Encounter for screening for diseases of the blood and blood-forming organs and certain disorders involving the immune mechanism: Secondary | ICD-10-CM | POA: Diagnosis not present

## 2014-07-31 DIAGNOSIS — E785 Hyperlipidemia, unspecified: Secondary | ICD-10-CM | POA: Diagnosis not present

## 2014-07-31 DIAGNOSIS — Z0000 Encounter for general adult medical examination without abnormal findings: Secondary | ICD-10-CM

## 2014-07-31 DIAGNOSIS — E221 Hyperprolactinemia: Secondary | ICD-10-CM | POA: Diagnosis not present

## 2014-07-31 LAB — CBC WITH DIFFERENTIAL/PLATELET
Basophils Absolute: 0 K/uL (ref 0.0–0.1)
Basophils Relative: 0 % (ref 0–1)
Eosinophils Absolute: 0.2 K/uL (ref 0.0–0.7)
Eosinophils Relative: 3 % (ref 0–5)
HCT: 43 % (ref 39.0–52.0)
Hemoglobin: 14.4 g/dL (ref 13.0–17.0)
Lymphocytes Relative: 39 % (ref 12–46)
Lymphs Abs: 2.7 K/uL (ref 0.7–4.0)
MCH: 28.4 pg (ref 26.0–34.0)
MCHC: 33.5 g/dL (ref 30.0–36.0)
MCV: 84.8 fL (ref 78.0–100.0)
MPV: 9.1 fL — ABNORMAL LOW (ref 9.4–12.4)
Monocytes Absolute: 0.5 K/uL (ref 0.1–1.0)
Monocytes Relative: 8 % (ref 3–12)
Neutro Abs: 3.4 K/uL (ref 1.7–7.7)
Neutrophils Relative %: 50 % (ref 43–77)
Platelets: 264 K/uL (ref 150–400)
RBC: 5.07 MIL/uL (ref 4.22–5.81)
RDW: 14.2 % (ref 11.5–15.5)
WBC: 6.8 K/uL (ref 4.0–10.5)

## 2014-07-31 LAB — TSH: TSH: 1.391 u[IU]/mL (ref 0.350–4.500)

## 2014-07-31 NOTE — Progress Notes (Signed)
Subjective:  This chart was scribed for [name], MD by Molli Posey, Medical scribe. This patient was seen in ROOM 3 and the patient's care was started 5:05 PM.   Patient ID: Paul King, male    DOB: [birth year], 68 y.o.   MRN: [id number]   Chief Complaint  Patient presents with  . Check Up    HPI HPI Comments: Paul King is a 68 y.o. male with a history of osteoporosis, hyperlipidemia and hypoprolactinemia who presents to [facility] for a checkup. Pt states his PSA was 0.9 recently. He states he had a colonoscopy about 3 months ago and had polyps removed. Pt reports he is satisfied with his Cabergoline medication. He states he thinks he needs to go to the optometrist but says he thinks his eye prescription does not need to be changed. Pt states he plays golf 1-2 a week and is walking 5-6 miles weekly. Pt complains of no symptoms or modifying factors at this time. He denies any problems with sleeping.   Health maintenance items up to date  Patient Active Problem List   Diagnosis Date Noted  . MVP (mitral valve prolapse) 10/07/2012  . Palpitations   . Hyperprolactinemia----Dr [facility] 05/17/2012  . Osteoporosis--resolving w/ rx prolac 02/05/2012  . HYPERCHOLESTEROLEMIA---resolv w/ diet 03/08/2009  . INTERNAL HEMORRHOIDS 03/08/2009  . ESOPHAGEAL STRICTURE--stable 03/08/2009  . GERD--stable 03/08/2009   Current Outpatient Prescriptions on File Prior to Visit  Medication Sig Dispense Refill  . aspirin 81 MG tablet Take 81 mg by mouth daily.    . cabergoline (DOSTINEX) 0.5 MG tablet Take 0.25 mg by mouth 2 (two) times a week.    Marland Kitchen CALCIUM & MAGNESIUM CARBONATES PO Take 1,000 mg by mouth daily.    . Calcium-Magnesium-Zinc (V-R CALCIUM-MAGNESIUM-ZINC) 333-133-5 MG TABS Take by mouth.    . cholecalciferol (VITAMIN D) 1000 UNITS tablet Take 2,000 Units by mouth daily.     . hydrocortisone butyrate (LUCOID) 0.1 % CREA cream Apply topically 2 (two) times daily as needed.       . Multiple Vitamins-Minerals (MULTIVITAMIN WITH MINERALS) tablet Take 1 tablet by mouth daily.     No current facility-administered medications on file prior to visit.   Past Surgical History  Procedure Laterality Date  . Tonsillectomy  1951  . Mohs surgery  1996  . Facial reconstruction surgery  1996  . US echocardiography  07/05/2012    mild-mod LA dilation,mild mitral insuff,mild AI  . Cardiovascular stress test  04/23/2011    negative    Allergies  Allergen Reactions  . Sulfonamide Derivatives     Review of Systems  Constitutional: Negative for fever, activity change, appetite change, fatigue and unexpected weight change.  HENT: Negative for congestion, trouble swallowing and voice change.   Eyes: Negative for visual disturbance.  Respiratory: Negative for cough, chest tightness, shortness of breath and wheezing.   Cardiovascular: Negative for chest pain, palpitations and leg swelling.  Gastrointestinal: Negative for abdominal pain.  Genitourinary: Negative for urgency, decreased urine volume and difficulty urinating.  Musculoskeletal: Negative for myalgias, back pain, arthralgias and neck stiffness.  Neurological: Negative for dizziness, speech difficulty, weakness and headaches.  Hematological: Negative for adenopathy. Does not bruise/bleed easily.  Psychiatric/Behavioral: Negative for behavioral problems, sleep disturbance, dysphoric mood and decreased concentration.      Objective:   Physical Exam  Constitutional: He is oriented to person, place, and time. He appears well-developed and well-nourished. No distress.  HENT:  Head: Normocephalic and atraumatic.  Right Ear: External ear normal.  Left Ear: External ear normal.  Nose: Nose normal.  Mouth/Throat: Oropharynx is clear and moist.  Tms and canals clear  Eyes: Conjunctivae and EOM are normal. Pupils are equal, round, and reactive to light.  Neck: Normal range of motion. Neck supple. No thyromegaly present.   Cardiovascular: Normal rate, regular rhythm, normal heart sounds and intact distal pulses.   No murmur heard. Pulmonary/Chest: Effort normal and breath sounds normal. No respiratory distress. He has no wheezes. He has no rales.  Abdominal: Soft. Bowel sounds are normal. He exhibits no distension and no mass. There is no tenderness. There is no rebound and no guarding.  No hepatosplenomegaly  Musculoskeletal: Normal range of motion. He exhibits no edema or tenderness.  Lymphadenopathy:    He has no cervical adenopathy.  Neurological: He is alert and oriented to person, place, and time. He has normal reflexes. No cranial nerve deficit. He exhibits normal muscle tone. Coordination normal.  Skin: Skin is warm and dry. No rash noted.  Psychiatric: He has a normal mood and affect. His behavior is normal. Judgment and thought content normal.  Nursing note and vitals reviewed.  Filed Vitals:   07/31/14 1650  BP: 120/70  Pulse: 84  Temp: 98.5 F (36.9 C)  Resp: 16  Height: 6' (1.829 m)  Weight: 167 lb (75.751 kg)  SpO2: 96%      Assessment & Plan:  I have completed the patient encounter in its entirety as documented by the scribe, with editing by me where necessary. Norwin Aleman [name], M.D.  Annual physical exam  Hyperprolactinemia - Plan: CBC with Differential, Comprehensive metabolic panel, TSH  Screening for deficiency anemia  Hyperlipidemia - Plan: Lipid panel  Notify labs

## 2014-08-01 LAB — COMPREHENSIVE METABOLIC PANEL
ALK PHOS: 59 U/L (ref 39–117)
ALT: 16 U/L (ref 0–53)
AST: 19 U/L (ref 0–37)
Albumin: 4.2 g/dL (ref 3.5–5.2)
BILIRUBIN TOTAL: 0.6 mg/dL (ref 0.2–1.2)
BUN: 20 mg/dL (ref 6–23)
CO2: 31 meq/L (ref 19–32)
Calcium: 9.3 mg/dL (ref 8.4–10.5)
Chloride: 100 mEq/L (ref 96–112)
Creat: 0.92 mg/dL (ref 0.50–1.35)
Glucose, Bld: 95 mg/dL (ref 70–99)
POTASSIUM: 4.1 meq/L (ref 3.5–5.3)
Sodium: 136 mEq/L (ref 135–145)
Total Protein: 6.9 g/dL (ref 6.0–8.3)

## 2014-08-01 LAB — LIPID PANEL
CHOL/HDL RATIO: 5 ratio
Cholesterol: 231 mg/dL — ABNORMAL HIGH (ref 0–200)
HDL: 46 mg/dL (ref >39)
LDL CALC: 138 mg/dL — AB (ref 0–99)
Triglycerides: 233 mg/dL — ABNORMAL HIGH (ref <150)
VLDL: 47 mg/dL — ABNORMAL HIGH (ref 0–40)

## 2014-08-05 ENCOUNTER — Encounter: Payer: Self-pay | Admitting: Internal Medicine

## 2014-08-14 ENCOUNTER — Telehealth: Payer: Self-pay

## 2014-08-14 NOTE — Telephone Encounter (Signed)
Spoke to patient to remind him to get his flu shot.  He states that he will walk in one day this week to get it.

## 2014-08-17 ENCOUNTER — Ambulatory Visit (INDEPENDENT_AMBULATORY_CARE_PROVIDER_SITE_OTHER): Payer: Medicare Other | Admitting: Family Medicine

## 2014-08-17 VITALS — BP 100/60 | HR 70 | Temp 98.0°F | Resp 16 | Ht 72.0 in | Wt 168.0 lb

## 2014-08-17 DIAGNOSIS — Z283 Underimmunization status: Secondary | ICD-10-CM | POA: Diagnosis not present

## 2014-08-17 DIAGNOSIS — Z2839 Other underimmunization status: Secondary | ICD-10-CM

## 2014-08-17 DIAGNOSIS — Z23 Encounter for immunization: Secondary | ICD-10-CM

## 2014-08-17 NOTE — Progress Notes (Signed)
Subjective: Patient was here recently and got his physical examination but forgot to get some immunizations. He is soon going to have his first grandchild, and wishes to be certain he is immunized probably for pertussis. Also needs his flu shot.  Objective: Did not examine him. He is healthy currently.  Assessment: Immunization review and update Influenza vaccine  Plan: Probable old chart to check on need for the pertussis

## 2014-08-17 NOTE — Patient Instructions (Addendum)
You will have receive theTDaP and flu shot  Return as needed  Congratulations on the grandchild  Influenza Vaccine (Flu Vaccine, Inactivated or Recombinant) 2014-2015: What You Need to Know 1. Why get vaccinated? Influenza ("flu") is a contagious disease that spreads around the [place] every winter, usually between October and May. Flu is caused by influenza viruses, and is spread mainly by coughing, sneezing, and close contact. Anyone can get flu, but the risk of getting flu is highest among children. Symptoms come on suddenly and may last several days. They can include:  fever/chills  sore throat  muscle aches  fatigue  cough  headache  runny or stuffy nose Flu can make some people much sicker than others. These people include young children, people 69 and older, pregnant women, and people with certain health conditions-such as heart, lung or kidney disease, nervous system disorders, or a weakened immune system. Flu vaccination is especially important for these people, and anyone in close contact with them. Flu can also lead to pneumonia, and make existing medical conditions worse. It can cause diarrhea and seizures in children. Each year thousands of people in the [place] die from flu, and many more are hospitalized. Flu vaccine is the best protection against flu and its complications. Flu vaccine also helps prevent spreading flu from person to person. 2. Inactivated and recombinant flu vaccines You are getting an injectable flu vaccine, which is either an "inactivated" or "recombinant" vaccine. These vaccines do not contain any live influenza virus. They are given by injection with a needle, and often called the "flu shot."  A different live, attenuated (weakened) influenza vaccine is sprayed into the nostrils. This vaccine is described in a separate Vaccine Information Statement. Flu vaccination is recommended every year. Some children 6 months through 53 years of age  might need two doses during one year. Flu viruses are always changing. Each year's flu vaccine is made to protect against 3 or 4 viruses that are likely to cause disease that year. Flu vaccine cannot prevent all cases of flu, but it is the best defense against the disease.  It takes about 2 weeks for protection to develop after the vaccination, and protection lasts several months to a year. Some illnesses that are not caused by influenza virus are often mistaken for flu. Flu vaccine will not prevent these illnesses. It can only prevent influenza. Some inactivated flu vaccine contains a very small amount of a mercury-based preservative called thimerosal. Studies have shown that thimerosal in vaccines is not harmful, but flu vaccines that do not contain a preservative are available. 3. Some people should not get this vaccine Tell the person who gives you the vaccine:  If you have any severe, life-threatening allergies. If you ever had a life-threatening allergic reaction after a dose of flu vaccine, or have a severe allergy to any part of this vaccine, including (for example) an allergy to gelatin, antibiotics, or eggs, you may be advised not to get vaccinated. Most, but not all, types of flu vaccine contain a small amount of egg protein.  If you ever had Guillain-Barr Syndrome (a severe paralyzing illness, also called GBS). Some people with a history of GBS should not get this vaccine. This should be discussed with your doctor.  If you are not feeling well. It is usually okay to get flu vaccine when you have a mild illness, but you might be advised to wait until you feel better. You should come back when you are better.  4. Risks of a vaccine reaction With a vaccine, like any medicine, there is a chance of side effects. These are usually mild and go away on their own. Problems that could happen after any vaccine:  Brief fainting spells can happen after any medical procedure, including vaccination.  Sitting or lying down for about 15 minutes can help prevent fainting, and injuries caused by a fall. Tell your doctor if you feel dizzy, or have vision changes or ringing in the ears.  Severe shoulder pain and reduced range of motion in the arm where a shot was given can happen, very rarely, after a vaccination.  Severe allergic reactions from a vaccine are very rare, estimated at less than 1 in a million doses. If one were to occur, it would usually be within a few minutes to a few hours after the vaccination. Mild problems following inactivated flu vaccine:  soreness, redness, or swelling where the shot was given  hoarseness  sore, red or itchy eyes  cough  fever  aches  headache  itching  fatigue If these problems occur, they usually begin soon after the shot and last 1 or 2 days. Moderate problems following inactivated flu vaccine:  Young children who get inactivated flu vaccine and pneumococcal vaccine (PCV13) at the same time may be at increased risk for seizures caused by fever. Ask your doctor for more information. Tell your doctor if a child who is getting flu vaccine has ever had a seizure. Inactivated flu vaccine does not contain live flu virus, so you cannot get the flu from this vaccine. As with any medicine, there is a very remote chance of a vaccine causing a serious injury or death. The safety of vaccines is always being monitored. For more information, visit: http://www.aguilar.org/ 5. What if there is a serious reaction? What should I look for?  Look for anything that concerns you, such as signs of a severe allergic reaction, very high fever, or behavior changes. Signs of a severe allergic reaction can include hives, swelling of the face and throat, difficulty breathing, a fast heartbeat, dizziness, and weakness. These would start a few minutes to a few hours after the vaccination. What should I do?  If you think it is a severe allergic reaction or other  emergency that can't wait, call 9-1-1 and get the person to the nearest hospital. Otherwise, call your doctor.  Afterward, the reaction should be reported to the Vaccine Adverse Event Reporting System (VAERS). Your doctor should file this report, or you can do it yourself through the VAERS web site at www.vaers.SamedayNews.es, or by calling 828-662-0769. VAERS does not give medical advice. 6. The National Vaccine Injury Compensation Program The [employer] Vaccine Injury Compensation Program (VICP) is a federal program that was created to compensate people who may have been injured by certain vaccines. Persons who believe they may have been injured by a vaccine can learn about the program and about filing a claim by calling 726-497-4345 or visiting the [place] website at GoldCloset.com.ee. There is a time limit to file a claim for compensation. 7. How can I learn more?  Ask your health care provider.  Call your local or state health department.  Contact the Centers for Disease Control and Prevention (CDC):  Call (757)495-6057 (1-800-CDC-INFO) or  Visit CDC's website at https://gibson.com/ CDC Vaccine Information Statement (Interim) Inactivated Influenza Vaccine (04/19/2013) Document Released: 06/12/2006 Document Revised: 01/02/2014 Document Reviewed: 08/05/2013 [facility] Patient Information 2015 [place]. This information is not intended to replace advice given  to you by your health care provider. Make sure you discuss any questions you have with your health care provider. Tdap Vaccine (Tetanus, Diphtheria, Pertussis): What You Need to Know 1. Why get vaccinated? Tetanus, diphtheria and pertussis can be very serious diseases, even for adolescents and adults. Tdap vaccine can protect [place] from these diseases. TETANUS (Lockjaw) causes painful muscle tightening and stiffness, usually all over the body.  It can lead to tightening of muscles in the head and neck so you can't open your  mouth, swallow, or sometimes even breathe. Tetanus kills about 1 out of 5 people who are infected. DIPHTHERIA can cause a thick coating to form in the back of the throat.  It can lead to breathing problems, paralysis, heart failure, and death. PERTUSSIS (Whooping Cough) causes severe coughing spells, which can cause difficulty breathing, vomiting and disturbed sleep.  It can also lead to weight loss, incontinence, and rib fractures. Up to 2 in 100 adolescents and 5 in 100 adults with pertussis are hospitalized or have complications, which could include pneumonia or death. These diseases are caused by bacteria. Diphtheria and pertussis are spread from person to person through coughing or sneezing. Tetanus enters the body through cuts, scratches, or wounds. Before vaccines, the [place] saw as many as 200,000 cases a year of diphtheria and pertussis, and hundreds of cases of tetanus. Since vaccination began, tetanus and diphtheria have dropped by about 99% and pertussis by about 80%. 2. Tdap vaccine Tdap vaccine can protect adolescents and adults from tetanus, diphtheria, and pertussis. One dose of Tdap is routinely given at age 21 or 14. People who did not get Tdap at that age should get it as soon as possible. Tdap is especially important for health care professionals and anyone having close contact with a baby younger than 12 months. Pregnant women should get a dose of Tdap during every pregnancy, to protect the newborn from pertussis. Infants are most at risk for severe, life-threatening complications from pertussis. A similar vaccine, called Td, protects from tetanus and diphtheria, but not pertussis. A Td booster should be given every 10 years. Tdap may be given as one of these boosters if you have not already gotten a dose. Tdap may also be given after a severe cut or burn to prevent tetanus infection. Your doctor can give you more information. Tdap may safely be given at the same time as  other vaccines. 3. Some people should not get this vaccine  If you ever had a life-threatening allergic reaction after a dose of any tetanus, diphtheria, or pertussis containing vaccine, OR if you have a severe allergy to any part of this vaccine, you should not get Tdap. Tell your doctor if you have any severe allergies.  If you had a coma, or long or multiple seizures within 7 days after a childhood dose of DTP or DTaP, you should not get Tdap, unless a cause other than the vaccine was found. You can still get Td.  Talk to your doctor if you:  have epilepsy or another nervous system problem,  had severe pain or swelling after any vaccine containing diphtheria, tetanus or pertussis,  ever had Guillain-Barr Syndrome (GBS),  aren't feeling well on the day the shot is scheduled. 4. Risks of a vaccine reaction With any medicine, including vaccines, there is a chance of side effects. These are usually mild and go away on their own, but serious reactions are also possible. Brief fainting spells can follow a vaccination, leading to  injuries from falling. Sitting or lying down for about 15 minutes can help prevent these. Tell your doctor if you feel dizzy or light-headed, or have vision changes or ringing in the ears. Mild problems following Tdap (Did not interfere with activities)  Pain where the shot was given (about 3 in 4 adolescents or 2 in 3 adults)  Redness or swelling where the shot was given (about 1 person in 5)  Mild fever of at least 100.40F (up to about 1 in 25 adolescents or 1 in 100 adults)  Headache (about 3 or 4 people in 10)  Tiredness (about 1 person in 3 or 4)  Nausea, vomiting, diarrhea, stomach ache (up to 1 in 4 adolescents or 1 in 10 adults)  Chills, body aches, sore joints, rash, swollen glands (uncommon) Moderate problems following Tdap (Interfered with activities, but did not require medical attention)  Pain where the shot was given (about 1 in 5 adolescents  or 1 in 100 adults)  Redness or swelling where the shot was given (up to about 1 in 16 adolescents or 1 in 25 adults)  Fever over 102F (about 1 in 100 adolescents or 1 in 250 adults)  Headache (about 3 in 20 adolescents or 1 in 10 adults)  Nausea, vomiting, diarrhea, stomach ache (up to 1 or 3 people in 100)  Swelling of the entire arm where the shot was given (up to about 3 in 100). Severe problems following Tdap (Unable to perform usual activities; required medical attention)  Swelling, severe pain, bleeding and redness in the arm where the shot was given (rare). A severe allergic reaction could occur after any vaccine (estimated less than 1 in a million doses). 5. What if there is a serious reaction? What should I look for?  Look for anything that concerns you, such as signs of a severe allergic reaction, very high fever, or behavior changes. Signs of a severe allergic reaction can include hives, swelling of the face and throat, difficulty breathing, a fast heartbeat, dizziness, and weakness. These would start a few minutes to a few hours after the vaccination. What should I do?  If you think it is a severe allergic reaction or other emergency that can't wait, call 9-1-1 or get the person to the nearest hospital. Otherwise, call your doctor.  Afterward, the reaction should be reported to the "Vaccine Adverse Event Reporting System" (VAERS). Your doctor might file this report, or you can do it yourself through the VAERS web site at www.vaers.SamedayNews.es, or by calling 530-674-3006. VAERS is only for reporting reactions. They do not give medical advice.  6. The National Vaccine Injury Compensation Program The [employer] Vaccine Injury Compensation Program (VICP) is a federal program that was created to compensate people who may have been injured by certain vaccines. Persons who believe they may have been injured by a vaccine can learn about the program and about filing a claim by calling  (720) 584-5813 or visiting the [place] website at GoldCloset.com.ee. 7. How can I learn more?  Ask your doctor.  Call your local or state health department.  Contact the Centers for Disease Control and Prevention (CDC):  Call 828-567-7212 or visit CDC's website at http://hunter.com/. CDC Tdap Vaccine VIS (01/08/12) Document Released: 02/17/2012 Document Revised: 01/02/2014 Document Reviewed: 11/30/2013 [employer] Patient Information 2015 [place], [place]. This information is not intended to replace advice given to you by your health care provider. Make sure you discuss any questions you have with your health care provider.

## 2015-02-20 ENCOUNTER — Telehealth: Payer: Self-pay | Admitting: Cardiovascular Disease

## 2015-02-23 ENCOUNTER — Ambulatory Visit (INDEPENDENT_AMBULATORY_CARE_PROVIDER_SITE_OTHER): Payer: Medicare Other | Admitting: Internal Medicine

## 2015-02-23 VITALS — BP 116/68 | HR 80 | Temp 98.6°F | Resp 17 | Ht 71.5 in | Wt 166.0 lb

## 2015-02-23 DIAGNOSIS — H109 Unspecified conjunctivitis: Secondary | ICD-10-CM

## 2015-02-23 MED ORDER — TOBRAMYCIN 0.3 % OP SOLN: 2.0000 [drp] | Freq: Four times a day (QID) | OPHTHALMIC | Status: DC

## 2015-02-23 NOTE — Telephone Encounter (Signed)
Closed encounter °

## 2015-02-23 NOTE — Progress Notes (Signed)
   Subjective:  This chart was scribed for [name], MD by Leandra Kern, Medical Scribe. This patient was seen in Room 3 and the patient's care was started at 11:59 AM.   Patient ID: Paul King, male    DOB: September 21, 1945, 69 y.o.   MRN: [id number]  HPI HPI Comments: Paul King is a 69 y.o. male who presents to [facility] complaining of left eye irritaion, sudden onset two days ago.  Pt notes that he woke up on morning and has noticed irritation to the area with erythema, and he described having a "white spot" present, however he notes that it has disappeared since. Pt denies having blurred vision.  Pt notes that 4 years ago, when he was still working,  he was experiencing a discomfort in his chest area, and was diagonised with unspecified chest pain, the problem has resolves since then. However, he notes that the problem has came back. He occasionally  feels discomfort and heaviness to the chest area. He states that burbing and being active usually relief the problem,.   Review of Systems  Eyes: Positive for pain and redness. Negative for visual disturbance.  Respiratory: Positive for chest tightness.        Objective:   Physical Exam  Constitutional: He is oriented to person, place, and time. He appears well-developed and well-nourished. No distress.  HENT:  Head: Normocephalic and atraumatic.  Mouth/Throat: Oropharynx is clear and moist.  Bulbar Conjunctiva are clear bilaterally but the left palpebral conjunctiva is inflamed with a clear watery discharge Pupils equal round reactive to light and accommodation with full extraocular movements Vision intact  Eyes: Pupils are equal, round, and reactive to light.  Neck: Neck supple.  Cardiovascular: Normal rate, regular rhythm, normal heart sounds and intact distal pulses.   No murmur heard. Pulmonary/Chest: Effort normal.  Neurological: He is alert and oriented to person, place, and time. No cranial  nerve deficit.  Skin: Skin is warm and dry.  Psychiatric: He has a normal mood and affect. His behavior is normal.  Nursing note and vitals reviewed.     Assessment & Plan:  I have completed the patient encounter in its entirety as documented by the scribe, with editing by me where necessary. Hendrick Pavich [name], M.D.  Problem #1 conjunctivitis Problem #2 mild esophageal spasm? Recurrence of stricture  Meds ordered this encounter  Medications  . tobramycin (TOBREX) 0.3 % ophthalmic solution    Sig: Place 2 drops into the left eye every 6 (six) hours.    Dispense:  5 mL    Refill:  0   He will wait on GI referral and follow his symptoms for now

## 2015-03-01 ENCOUNTER — Ambulatory Visit (INDEPENDENT_AMBULATORY_CARE_PROVIDER_SITE_OTHER): Payer: Medicare Other | Admitting: Family Medicine

## 2015-03-01 VITALS — BP 116/62 | HR 83 | Temp 98.7°F | Resp 18 | Ht 71.0 in | Wt 169.0 lb

## 2015-03-01 DIAGNOSIS — R05 Cough: Secondary | ICD-10-CM | POA: Diagnosis not present

## 2015-03-01 DIAGNOSIS — J069 Acute upper respiratory infection, unspecified: Secondary | ICD-10-CM

## 2015-03-01 DIAGNOSIS — R059 Cough, unspecified: Secondary | ICD-10-CM

## 2015-03-01 MED ORDER — HYDROCODONE-HOMATROPINE 5-1.5 MG/5ML PO SYRP: 5.0000 mL | ORAL_SOLUTION | Freq: Every evening | ORAL | Status: DC | PRN

## 2015-03-01 MED ORDER — AZITHROMYCIN 250 MG PO TABS: ORAL_TABLET | ORAL | Status: DC

## 2015-03-01 MED ORDER — BENZONATATE 200 MG PO CAPS: 200.0000 mg | ORAL_CAPSULE | Freq: Two times a day (BID) | ORAL | Status: DC | PRN

## 2015-03-01 NOTE — Progress Notes (Signed)
 Chief Complaint:  Chief Complaint  Patient presents with  . Cough    x2 days   . Nasal Congestion    HPI: Paul King is a 69 y.o. male who is here for  3 day history of productive cough, and some pleuritic chest pain with cough only, no subjective fevers or chills. He has been around a 21-monthold granddaughter who is having some sinus issues. He denies any allergies. He does not have any facial pain but has had some nasal congestion. He has taken over-the-counter throat lozenges for the cough which chest helps and also see with honey. He denies any muscle aches or pain, nausea vomiting abdominal pain or diarrhea or rashes. He denies any ear pain. He denies any wheezing or shortness of breath. This morning when he woke up he did have a rattle in his lungs which she cleared up with coughing and T and honey. He does have a history of intermittent palpitations only at night. He's being followed by cardiologist says that it is benign. He has a history of hypoprolactinemia and has been on medications for last 4 years. He has no symptoms from this.   SpO2 Readings from Last 3 Encounters:  03/01/15 98%  02/23/15 98%  08/17/14 98%     Past Medical History  Diagnosis Date  . Osteoporosis   . Palpitations   . Hyperlipidemia   . Hypoprolactinemia    Past Surgical History  Procedure Laterality Date  . Tonsillectomy  1951  . Mohs surgery  1996  . Facial reconstruction surgery  1996  . [place]ocardiography  07/05/2012    mild-mod LA dilation,mild mitral insuff,mild AI  . Cardiovascular stress test  04/23/2011    negative   History   Social History  . Marital Status: Married    Spouse Name: N/A  . Number of Children: N/A  . Years of Education: N/A   Social History Main Topics  . Smoking status: Never Smoker   . Smokeless tobacco: Not on file  . Alcohol Use: 0.0 oz/week    0 Standard drinks or equivalent per week     Comment: 4 glasses of wine a week  . Drug Use: No  .  Sexual Activity: Not on file   Other Topics Concern  . None   Social History Narrative   Family History  Problem Relation Age of Onset  . Heart failure Mother   . Stroke Mother   . Stroke Father    Allergies  Allergen Reactions  . Sulfonamide Derivatives    Prior to Admission medications   Medication Sig Start Date End Date Taking? Authorizing Provider  aspirin 81 MG tablet Take 81 mg by mouth daily.   Yes Historical Provider, MD  cabergoline (DOSTINEX) 0.5 MG tablet Take 0.25 mg by mouth 2 (two) times a week.   Yes Historical Provider, MD  CALCIUM & MAGNESIUM CARBONATES PO Take 1,000 mg by mouth daily.   Yes Historical Provider, MD  Calcium-Magnesium-Zinc (V-R CALCIUM-MAGNESIUM-ZINC) 333-133-5 MG TABS Take by mouth.   Yes Historical Provider, MD  cholecalciferol (VITAMIN D) 1000 UNITS tablet Take 2,000 Units by mouth daily.    Yes Historical Provider, MD  hydrocortisone butyrate (LUCOID) 0.1 % CREA cream Apply topically 2 (two) times daily as needed.    Yes Historical Provider, MD  Multiple Vitamins-Minerals (MULTIVITAMIN WITH MINERALS) tablet Take 1 tablet by mouth daily.   Yes Historical Provider, MD  tobramycin (TOBREX) 0.3 % ophthalmic solution Place 2  drops into the left eye every 6 (six) hours. 02/23/15  Yes Leandrew Koyanagi, MD     ROS: The patient denies fevers, chills, night sweats, unintentional weight loss, chest pain, palpitations, wheezing, dyspnea on exertion, nausea, vomiting, abdominal pain, dysuria, hematuria, melena, numbness, weakness, or tingling.   All other systems have been reviewed and were otherwise negative with the exception of those mentioned in the HPI and as above.    PHYSICAL EXAM: Filed Vitals:   03/01/15 0808  BP: 116/62  Pulse: 83  Temp: 98.7 F (37.1 C)  Resp: 18   Filed Vitals:   03/01/15 0808  Height: _0  (1.803 m)  Weight: 169 lb (76.658 kg)   Body mass index is 23.58 kg/(m^2).   General: Alert, no acute distress HEENT:   Normocephalic, atraumatic, oropharynx patent. EOMI, PERRLA Erythematous throat, no exudates, TM normal, +/- sinus tenderness, + erythematous/boggy nasal mucosa Cardiovascular:  Regular rate and rhythm, no rubs murmurs or gallops.  No Carotid bruits, radial pulse intact. No pedal edema.  Respiratory: Clear to auscultation bilaterally.  No wheezes, rales, or rhonchi.  No cyanosis, no use of accessory musculature GI: No organomegaly, abdomen is soft and non-tender, positive bowel sounds.  No masses. Skin: No rashes. Neurologic: Facial musculature symmetric. Psychiatric: Patient is appropriate throughout our interaction. Lymphatic: No cervical lymphadenopathy Musculoskeletal: Gait intact.   LABS: Results for orders placed or performed in visit on 07/31/14  CBC with Differential  Result Value Ref Range   WBC 6.8 4.0 - 10.5 K/uL   RBC 5.07 4.22 - 5.81 MIL/uL   Hemoglobin 14.4 13.0 - 17.0 g/dL   HCT 43.0 39.0 - 52.0 %   MCV 84.8 78.0 - 100.0 fL   MCH 28.4 26.0 - 34.0 pg   MCHC 33.5 30.0 - 36.0 g/dL   RDW 14.2 11.5 - 15.5 %   Platelets 264 150 - 400 K/uL   Neutrophils Relative % 50 43 - 77 %   Neutro Abs 3.4 1.7 - 7.7 K/uL   Lymphocytes Relative 39 12 - 46 %   Lymphs Abs 2.7 0.7 - 4.0 K/uL   Monocytes Relative 8 3 - 12 %   Monocytes Absolute 0.5 0.1 - 1.0 K/uL   Eosinophils Relative 3 0 - 5 %   Eosinophils Absolute 0.2 0.0 - 0.7 K/uL   Basophils Relative 0 0 - 1 %   Basophils Absolute 0.0 0.0 - 0.1 K/uL   Smear Review Criteria for review not met    MPV 9.1 (L) 9.4 - 12.4 fL  Comprehensive metabolic panel  Result Value Ref Range   Sodium 136 135 - 145 mEq/L   Potassium 4.1 3.5 - 5.3 mEq/L   Chloride 100 96 - 112 mEq/L   CO2 31 19 - 32 mEq/L   Glucose, Bld 95 70 - 99 mg/dL   BUN 20 6 - 23 mg/dL   Creat 0.92 0.50 - 1.35 mg/dL   Total Bilirubin 0.6 0.2 - 1.2 mg/dL   Alkaline Phosphatase 59 39 - 117 U/L   AST 19 0 - 37 U/L   ALT 16 0 - 53 U/L   Total Protein 6.9 6.0 - 8.3 g/dL    Albumin 4.2 3.5 - 5.2 g/dL   Calcium 9.3 8.4 - 10.5 mg/dL  Lipid panel  Result Value Ref Range   Cholesterol 231 (H) 0 - 200 mg/dL   Triglycerides 233 (H) <150 mg/dL   HDL 46 >39 mg/dL   Total CHOL/HDL Ratio 5.0 Ratio  VLDL 47 (H) 0 - 40 mg/dL   LDL Cholesterol 138 (H) 0 - 99 mg/dL  TSH  Result Value Ref Range   TSH 1.391 0.350 - 4.500 uIU/mL     EKG/XRAY:   Primary read interpreted by Dr. Marin Comment at [facility].   ASSESSMENT/PLAN: Encounter Diagnoses  Name Primary?  . Cough Yes  . Acute upper respiratory infection    Symptomatic treatment first. Since it's a long weekend and he is traveling. I will go ahead and give him a prescription for azithromycin with precautions. He will go ahead and try Hycodan and [employer] Continue with fluids.  No improvement then may take Z-Pak.  Gross sideeffects, risk and benefits, and alternatives of medications d/w patient. Patient is aware that all medications have potential sideeffects and we are unable to predict every sideeffect or drug-drug interaction that may occur.  , [place], DO 03/01/2015 8:30 AM

## 2015-03-01 NOTE — Patient Instructions (Signed)
Upper Respiratory Infection, Adult An upper respiratory infection (URI) is also known as the common cold. It is often caused by a type of germ (virus). Colds are easily spread (contagious). You can pass it to others by kissing, coughing, sneezing, or drinking out of the same glass. Usually, you get better in 1 or 2 weeks.  HOME CARE   Only take medicine as told by your doctor.  Use a warm mist humidifier or breathe in steam from a hot shower.  Drink enough water and fluids to keep your pee (urine) clear or pale yellow.  Get plenty of rest.  Return to work when your temperature is back to normal or as told by your doctor. You may use a face mask and wash your hands to stop your cold from spreading. GET HELP RIGHT AWAY IF:   After the first few days, you feel you are getting worse.  You have questions about your medicine.  You have chills, shortness of breath, or brown or red spit (mucus).  You have yellow or brown snot (nasal discharge) or pain in the face, especially when you bend forward.  You have a fever, puffy (swollen) neck, pain when you swallow, or white spots in the back of your throat.  You have a bad headache, ear pain, sinus pain, or chest pain.  You have a high-pitched whistling sound when you breathe in and out (wheezing).  You have a lasting cough or cough up blood.  You have sore muscles or a stiff neck. MAKE SURE YOU:   Understand these instructions.  Will watch your condition.  Will get help right away if you are not doing well or get worse. Document Released: 02/04/2008 Document Revised: 11/10/2011 Document Reviewed: 11/23/2013 ExitCare Patient Information 2015 [employer]. This information is not intended to replace advice given to you by your health care provider. Make sure you discuss any questions you have with your health care provider.  

## 2015-05-31 ENCOUNTER — Ambulatory Visit (INDEPENDENT_AMBULATORY_CARE_PROVIDER_SITE_OTHER): Payer: Medicare Other | Admitting: Cardiovascular Disease

## 2015-05-31 ENCOUNTER — Encounter: Payer: Self-pay | Admitting: Cardiovascular Disease

## 2015-05-31 VITALS — BP 124/76 | HR 73 | Ht 72.0 in | Wt 171.0 lb

## 2015-05-31 DIAGNOSIS — E7800 Pure hypercholesterolemia, unspecified: Secondary | ICD-10-CM

## 2015-05-31 DIAGNOSIS — I341 Nonrheumatic mitral (valve) prolapse: Secondary | ICD-10-CM | POA: Diagnosis not present

## 2015-05-31 NOTE — Patient Instructions (Signed)
Dr. Croitoru recommends that you schedule a follow-up appointment in: ONE YEAR   

## 2015-06-01 NOTE — Progress Notes (Signed)
Patient ID: JACOBEY GURA, male   DOB: 09-13-1945, 69 y.o.   MRN: [id number]     Cardiology Office Note   Date:  06/01/2015   ID:  Paul King, DOB May 10, 1946, MRN [id number]  PCP:  Leandrew Koyanagi, MD  Cardiologist:   Sanda Klein, MD   Chief Complaint  Patient presents with  . Annual Exam    Patient has no complaints.      History of Present Illness: Paul King is a 69 y.o. male who presents for  Follow-up of hyperlipidemia, palpitations and clinical exam consistent with mitral valve prolapse but without significant mitral insufficiency. He continues to feel well and is physically active. He plays golf several times a week and rarely writes the cart. His palpitations have not bothered him recently.  As before, he only notices them when he lies in bed at night.  Previous echocardiograms have shown PVCs as a likely cause for his palpitations. His lipid profile still shows mildly elevated triglycerides and mildly elevated LDL cholesterol but he has never had evidence of coronary or peripheral vascular disease.    Past Medical History  Diagnosis Date  . Osteoporosis   . Palpitations   . Hyperlipidemia   . Hypoprolactinemia     Past Surgical History  Procedure Laterality Date  . Tonsillectomy  1951  . Mohs surgery  1996  . Facial reconstruction surgery  1996  . US echocardiography  07/05/2012    mild-mod LA dilation,mild mitral insuff,mild AI  . Cardiovascular stress test  04/23/2011    negative     Current Outpatient Prescriptions  Medication Sig Dispense Refill  . aspirin 81 MG tablet Take 81 mg by mouth daily.    . cabergoline (DOSTINEX) 0.5 MG tablet Take 0.25 mg by mouth 2 (two) times a week.    Marland Kitchen CALCIUM & MAGNESIUM CARBONATES PO Take 1,000 mg by mouth daily.    . cholecalciferol (VITAMIN D) 1000 UNITS tablet Take 2,000 Units by mouth daily.     . hydrocortisone butyrate (LUCOID) 0.1 % CREA cream Apply topically 2 (two) times daily as needed.     . Multiple  Vitamins-Minerals (MULTIVITAMIN WITH MINERALS) tablet Take 1 tablet by mouth daily.    . tazarotene (TAZORAC) 0.05 % cream Apply topically as needed.     No current facility-administered medications for this visit.    Allergies:   Sulfonamide derivatives    Social History:  The patient  reports that he has never smoked. He does not have any smokeless tobacco history on file. He reports that he drinks alcohol. He reports that he does not use illicit drugs.   Family History:  The patient's family history includes Heart failure in his mother; Stroke in his father and mother.    ROS:  Please see the history of present illness.    Otherwise, review of systems positive for none.   All other systems are reviewed and negative.    PHYSICAL EXAM: VS:  BP 124/76 mmHg  Pulse 73  Ht 6' (1.829 m)  Wt 171 lb (77.565 kg)  BMI 23.19 kg/m2 , BMI Body mass index is 23.19 kg/(m^2).  General: Alert, oriented x3, no distress Head: no evidence of trauma, PERRL, EOMI, no exophtalmos or lid lag, no myxedema, no xanthelasma; normal ears, nose and oropharynx Neck: normal jugular venous pulsations and no hepatojugular reflux; brisk carotid pulses without delay and no carotid bruits Chest: clear to auscultation, no signs of consolidation by percussion or palpation, normal fremitus,  symmetrical and full respiratory excursions Cardiovascular: normal position and quality of the apical impulse, regular rhythm, normal first and second heart sounds,  Distinct midsystolic click , but even with the Valsalva maneuver there are no murmurs, rubs or gallops Abdomen: no tenderness or distention, no masses by palpation, no abnormal pulsatility or arterial bruits, normal bowel sounds, no hepatosplenomegaly Extremities: no clubbing, cyanosis or edema; 2+ radial, ulnar and brachial pulses bilaterally; 2+ right femoral, posterior tibial and dorsalis pedis pulses; 2+ left femoral, posterior tibial and dorsalis pedis pulses; no  subclavian or femoral bruits Neurological: grossly nonfocal Psych: euthymic mood, full affect   EKG:  EKG is ordered today. The ekg ordered today demonstrates  Sinus rhythm with leftward axis   Recent Labs: 07/31/2014: ALT 16; BUN 20; Creat 0.92; Hemoglobin 14.4; Platelets 264; Potassium 4.1; Sodium 136; TSH 1.391    Lipid Panel    Component Value Date/Time   CHOL 231* 07/31/2014 1735   TRIG 233* 07/31/2014 1735   HDL 46 07/31/2014 1735   CHOLHDL 5.0 07/31/2014 1735   VLDL 47* 07/31/2014 1735   LDLCALC 138* 07/31/2014 1735      Wt Readings from Last 3 Encounters:  05/31/15 171 lb (77.565 kg)  03/01/15 169 lb (76.658 kg)  02/23/15 166 lb (75.297 kg)    ASSESSMENT AND PLAN:  1.  Probable mitral valve prolapse with questionable diagnosis by echocardiography and without mitral insufficiency. No specific treatment is necessary  2.  Palpitations, probably related to PVCs. These are only mildly symptomatic and he prefers not to take medications for them  3.  Mild mixed hyperlipidemia without known vascular disease. He is lean and quite active but there may be room for dietary improvement since he has a weakness for sweets and baked goods. He will try to reduce his intake of sweets and starches.    Current medicines are reviewed at length with the patient today.  The patient does not have concerns regarding medicines.  The following changes have been made:  no change  Labs/ tests ordered today include:  Orders Placed This Encounter  Procedures  . EKG 12-Lead    Patient Instructions  Dr. [name] recommends that you schedule a follow-up appointment in: ONE YEAR     SignedSanda Klein, MD  06/01/2015 2:31 PM    Sanda Klein, MD, [facility] [phone] office [phone] pager

## 2015-07-30 ENCOUNTER — Encounter: Payer: Self-pay | Admitting: Internal Medicine

## 2015-09-16 ENCOUNTER — Encounter: Payer: Self-pay | Admitting: Internal Medicine

## 2015-10-10 ENCOUNTER — Ambulatory Visit (INDEPENDENT_AMBULATORY_CARE_PROVIDER_SITE_OTHER): Payer: Medicare Other | Admitting: Family Medicine

## 2015-10-10 VITALS — BP 128/80 | HR 73 | Temp 97.8°F | Resp 16 | Ht 71.0 in | Wt 168.0 lb

## 2015-10-10 DIAGNOSIS — J0101 Acute recurrent maxillary sinusitis: Secondary | ICD-10-CM | POA: Diagnosis not present

## 2015-10-10 MED ORDER — AMOXICILLIN-POT CLAVULANATE 875-125 MG PO TABS: 1.0000 | ORAL_TABLET | Freq: Two times a day (BID) | ORAL | Status: DC

## 2015-10-10 NOTE — Progress Notes (Signed)
By signing my name below, I, Moises Blood, attest that this documentation has been prepared under the direction and in the presence of Robyn Haber, MD. Electronically Signed: Moises Blood, [place]. 10/10/2015 , 6:53 PM .  Patient was seen in room 8 .   Patient ID: Paul King MRN: [id number], DOB: [birth year], 70 y.o. Date of Encounter: 10/10/2015  Primary Physician: Leandrew Koyanagi, MD  Chief Complaint:  Chief Complaint  Patient presents with   Nasal Congestion    x 10 days    Cough   Pressure Behind the Eyes   watery eyes    HPI:  Paul King is a 70 y.o. male who presents to [facility] complaining of nasal congestion with sinus pressure and cough that started about 10 days ago. Patient notes blowing out yellow phlegm in the mornings and during the day. He states that it dries up at night. He believes he has sick contact from his granddaughter, who is in daycare.   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    Past Medical History  Diagnosis Date   Osteoporosis    Palpitations    Hyperlipidemia    Hypoprolactinemia ([place])      Home Meds: Prior to Admission medications   Medication Sig Start Date End Date Taking? Authorizing Provider  aspirin 81 MG tablet Take 81 mg by mouth daily.   Yes Historical Provider, MD  cabergoline (DOSTINEX) 0.5 MG tablet Take 0.25 mg by mouth 2 (two) times a week.   Yes Historical Provider, MD  CALCIUM & MAGNESIUM CARBONATES PO Take 1,000 mg by mouth daily.   Yes Historical Provider, MD  cholecalciferol (VITAMIN D) 1000 UNITS tablet Take 2,000 Units by mouth daily.    Yes Historical Provider, MD  hydrocortisone butyrate (LUCOID) 0.1 % CREA cream Apply topically 2 (two) times daily as needed.    Yes Historical Provider, MD  Multiple Vitamins-Minerals (MULTIVITAMIN WITH MINERALS) tablet Take 1 tablet by mouth daily.   Yes Historical Provider, MD  tazarotene (TAZORAC) 0.05 % cream Apply topically as  needed.   Yes Historical Provider, MD    Allergies:  Allergies  Allergen Reactions   Sulfonamide Derivatives     Social History   Social History   Marital Status: Married    Spouse Name: N/A   Number of Children: N/A   Years of Education: N/A   Occupational History   Not on file.   Social History Main Topics   Smoking status: Never Smoker    Smokeless tobacco: Never Used   Alcohol Use: 0.0 oz/week    0 Standard drinks or equivalent per week     Comment: 4 glasses of wine a week   Drug Use: No   Sexual Activity: Not on file   Other Topics Concern   Not on file   Social History Narrative     Review of Systems: Constitutional: negative for fever, chills, night sweats, weight changes, or fatigue  HEENT: negative for vision changes, hearing loss, ST, epistaxis; positive for rhinorrhea, congestion, sinus pressure  Cardiovascular: negative for chest pain or palpitations Respiratory: negative for hemoptysis, wheezing, shortness of breath; positive for cough Abdominal: negative for abdominal pain, nausea, vomiting, diarrhea, or constipation Dermatological: negative for rash Neurologic: negative for headache, dizziness, or syncope All other systems reviewed and are otherwise negative with the exception to those above and in the HPI.  Physical Exam: Blood pressure 128/80, pulse 73, temperature 97.8 F (36.6 C),  temperature source Oral, resp. rate 16, height 5\' 11"  (1.803 m), weight 168 lb (76.204 kg), SpO2 97 %., Body mass index is 23.44 kg/(m^2). General: Well developed, well nourished, in no acute distress. Head: Normocephalic, atraumatic, eyes without discharge, sclera non-icteric. Bilateral auditory canals clear, TM's are without perforation, pearly grey and translucent with reflective cone of light bilaterally, throat clear, Nares with copious purulent discharge, nose red, nasal voice, facial swelling Neck: Supple. No thyromegaly. Full ROM. No  lymphadenopathy. Lungs: Clear bilaterally to auscultation without wheezes, rales, or rhonchi. Breathing is unlabored. Heart: RRR with S1 S2. No murmurs, rubs, or gallops appreciated. Msk:  Strength and tone normal for age. Extremities/Skin: Warm and dry. No clubbing or cyanosis. No edema. No rashes or suspicious lesions. Neuro: Alert and oriented X 3. Moves all extremities spontaneously. Gait is normal. CNII-XII grossly in tact. Psych:  Responds to questions appropriately with a normal affect.   Labs:  ASSESSMENT AND PLAN:  70 y.o. year old male with sinusitis This chart was scribed in my presence and reviewed by me personally.    ICD-9-CM ICD-10-CM   1. Acute recurrent maxillary sinusitis 461.0 J01.01 amoxicillin-clavulanate (AUGMENTIN) 875-125 MG tablet     Signed, Robyn Haber, MD     Signed, Robyn Haber, MD 10/10/2015 6:54 PM

## 2015-10-10 NOTE — Patient Instructions (Signed)

## 2015-11-02 ENCOUNTER — Other Ambulatory Visit: Payer: Self-pay | Admitting: Internal Medicine

## 2015-11-02 DIAGNOSIS — M8580 Other specified disorders of bone density and structure, unspecified site: Secondary | ICD-10-CM

## 2015-11-27 ENCOUNTER — Other Ambulatory Visit: Payer: Self-pay | Admitting: Internal Medicine

## 2015-11-27 DIAGNOSIS — M859 Disorder of bone density and structure, unspecified: Secondary | ICD-10-CM

## 2015-11-27 DIAGNOSIS — M8580 Other specified disorders of bone density and structure, unspecified site: Secondary | ICD-10-CM

## 2015-11-29 ENCOUNTER — Ambulatory Visit
Admission: RE | Admit: 2015-11-29 | Discharge: 2015-11-29 | Disposition: A | Discharge status: Home / Self Care | Payer: Medicare Other | Source: Ambulatory Visit | Attending: Internal Medicine | Admitting: Internal Medicine

## 2015-11-29 DIAGNOSIS — M8580 Other specified disorders of bone density and structure, unspecified site: Secondary | ICD-10-CM

## 2015-11-29 DIAGNOSIS — M859 Disorder of bone density and structure, unspecified: Secondary | ICD-10-CM

## 2015-12-22 ENCOUNTER — Ambulatory Visit (INDEPENDENT_AMBULATORY_CARE_PROVIDER_SITE_OTHER): Payer: Medicare Other | Admitting: Internal Medicine

## 2015-12-22 VITALS — BP 116/64 | HR 91 | Temp 97.8°F | Ht 72.0 in | Wt 168.0 lb

## 2015-12-22 DIAGNOSIS — E221 Hyperprolactinemia: Secondary | ICD-10-CM

## 2015-12-22 DIAGNOSIS — Z23 Encounter for immunization: Secondary | ICD-10-CM

## 2015-12-22 DIAGNOSIS — M810 Age-related osteoporosis without current pathological fracture: Secondary | ICD-10-CM

## 2015-12-22 MED ORDER — ZOSTER VACCINE LIVE 19400 UNT/0.65ML ~~LOC~~ SOLR: 0.6500 mL | Freq: Once | SUBCUTANEOUS | Status: DC

## 2015-12-22 NOTE — Progress Notes (Signed)
Subjective:  By signing my name below, I, Paul King, attest that this documentation has been prepared under the direction and in the presence of Paul Lin, MD.  Electronically Signed: Thea King, ED Scribe. 12/22/2015.  11:14 AM.  Patient ID: Paul King, male    DOB: 02-20-1946, 70 y.o.   MRN: [id number]  HPI   Chief Complaint  Patient presents with  . Follow-up    recheck    HPI Comments: Paul King is a 70 y.o. male who presents to the [facility] for a follow up.  Pt has been doing well. No complaints or concerns. He enjoys spending his time playing golf,  playing music and spending time with his grandchildren.   He had bone density testing 3/30. Results for [name] pending.  Last colonoscopy was about 3 years ago. He reports having polyps removed.   Pt has had flu shot. He agrees to pneumonia vaccine and shingles vaccine.   He has an appointment with neurologist in august. He does not need to seen by cardiologist, Dr. [name] until September, 4 months from now.  He plans to make an appointment with his dermatologist, Dr. [name]. He is seen by him every 6 months.   Pt would like suggestions for a future PCP.   Patient Active Problem List   Diagnosis Date Noted  . MVP (mitral valve prolapse) 10/07/2012  . Palpitations   . Hyperprolactinemia ([place]) 05/17/2012  . Osteoporosis 02/05/2012  . HYPERCHOLESTEROLEMIA 03/08/2009  . INTERNAL HEMORRHOIDS 03/08/2009  . ESOPHAGEAL STRICTURE 03/08/2009  . GERD 03/08/2009   Past Medical History  Diagnosis Date  . Osteoporosis   . Palpitations   . Hyperlipidemia   . Hypoprolactinemia [facility])    Past Surgical History  Procedure Laterality Date  . Tonsillectomy  1951  . Mohs surgery  1996  . Facial reconstruction surgery  1996  . US echocardiography  07/05/2012    mild-mod LA dilation,mild mitral insuff,mild AI  . Cardiovascular stress test  04/23/2011    negative   Allergies  Allergen  Reactions  . Sulfamethoxazole Shortness Of Breath  . Sulfonamide Derivatives    Prior to Admission medications   Medication Sig Start Date End Date Taking? Authorizing Provider  aspirin 81 MG tablet Take 81 mg by mouth daily.   Yes Historical Provider, MD  cabergoline (DOSTINEX) 0.5 MG tablet Take 0.25 mg by mouth 2 (two) times a week.   Yes Historical Provider, MD  CALCIUM & MAGNESIUM CARBONATES PO Take 1,000 mg by mouth daily.   Yes Historical Provider, MD  cholecalciferol (VITAMIN D) 1000 UNITS tablet Take 2,000 Units by mouth daily.    Yes Historical Provider, MD  hydrocortisone butyrate (LUCOID) 0.1 % CREA cream Apply topically 2 (two) times daily as needed.    Yes Historical Provider, MD  Multiple Vitamins-Minerals (MULTIVITAMIN WITH MINERALS) tablet Take 1 tablet by mouth daily.   Yes Historical Provider, MD  tazarotene (TAZORAC) 0.05 % cream Apply topically as needed.   Yes Historical Provider, MD   Review of Systems   Objective:   Physical Exam  Constitutional: He is oriented to person, place, and time. He appears well-developed and well-nourished. No distress.  HENT:  Head: Normocephalic and atraumatic.  Eyes: Conjunctivae and EOM are normal.  Neck: Neck supple.  Cardiovascular: Normal rate.   Pulmonary/Chest: Effort normal.  Musculoskeletal: Normal range of motion.  Neurological: He is alert and oriented to person, place, and time.  Skin: Skin is  warm and dry.  Psychiatric: He has a normal mood and affect. His behavior is normal.  Nursing note and vitals reviewed.  Filed Vitals:   12/22/15 0933  BP: 116/64  Pulse: 91  Temp: 97.8 F (36.6 C)  TempSrc: Oral  Height: 6' (1.829 m)  Weight: 168 lb (76.204 kg)  SpO2: 91%     Assessment & Plan:  I have completed the patient encounter in its entirety as documented by the scribe, with editing by me where necessary. [name] [name], M.D.  Hyperprolactinemia ([place])  Osteoporosis  Meds ordered this encounter    Medications  . zoster vaccine live, PF, (ZOSTAVAX) 09811 UNT/0.65ML injection    Sig: Inject 19,400 Units into the skin once. Administer at pharmacy    Dispense:  1 each    Refill:  0   Plans f/u with [facility] as suggested bu [name]

## 2015-12-22 NOTE — Patient Instructions (Signed)
     IF you received an x-ray today, you will receive an invoice from [facility]. Please contact [facility] at [phone] with questions or concerns regarding your invoice.   IF you received labwork today, you will receive an invoice from [facility]. Please contact [facility] at [phone] with questions or concerns regarding your invoice.   Our billing staff will not be able to assist you with questions regarding bills from these companies.  You will be contacted with the lab results as soon as they are available. The fastest way to get your results is to activate your My Chart account. Instructions are located on the last page of this paperwork. If you have not heard from us regarding the results in 2 weeks, please contact this office.      

## 2016-06-02 ENCOUNTER — Encounter: Payer: Self-pay | Admitting: Cardiovascular Disease

## 2016-06-02 ENCOUNTER — Ambulatory Visit (INDEPENDENT_AMBULATORY_CARE_PROVIDER_SITE_OTHER): Payer: Medicare Other | Admitting: Cardiovascular Disease

## 2016-06-02 VITALS — BP 111/75 | HR 64 | Ht 72.0 in | Wt 172.0 lb

## 2016-06-02 DIAGNOSIS — E782 Mixed hyperlipidemia: Secondary | ICD-10-CM | POA: Diagnosis not present

## 2016-06-02 DIAGNOSIS — I341 Nonrheumatic mitral (valve) prolapse: Secondary | ICD-10-CM | POA: Diagnosis not present

## 2016-06-02 DIAGNOSIS — I493 Ventricular premature depolarization: Secondary | ICD-10-CM

## 2016-06-02 NOTE — Patient Instructions (Signed)
Dr Croitoru recommends that you schedule a follow-up appointment in 1 year. You will receive a reminder letter in the mail two months in advance. If you don't receive a letter, please call our office to schedule the follow-up appointment.  If you need a refill on your cardiac medications before your next appointment, please call your pharmacy. 

## 2016-06-02 NOTE — Progress Notes (Signed)
Cardiology Office Note    Date:  06/02/2016   ID:  Paul King, DOB Aug 25, 1946, MRN [id number]  PCP:  Leandrew Koyanagi, MD  Cardiologist:   Sanda Klein, MD   Chief Complaint  Patient presents with  . Follow-up    12 Months; cramping ocassionally in legs.    History of Present Illness:  Paul King is a 70 y.o. male with history of mixed hyperlipidemia, hyperprolactinemia, questionable diagnosis of mitral valve prolapse returning a fall for palpitations which by previous ECgs appeared to be related to isolated PVCs. Over the last year the palpitations have been less noticeable than the past. They only occasionally bother him, consistently noticeable only when he lies in bed at night. They are never an issue during physical activity. He went on a golfing trip to [place] in [place] and walked 10 golf courses without using a cart, without limitations. He denies chest pain, shortness of breath, leg edema, focal neurological complaints, syncope, claudication.   Past Medical History:  Diagnosis Date  . Hyperlipidemia   . Hypoprolactinemia ([place])   . Osteoporosis   . Palpitations     Past Surgical History:  Procedure Laterality Date  . CARDIOVASCULAR STRESS TEST  04/23/2011   negative  . FACIAL RECONSTRUCTION SURGERY  1996  . [place]  . TONSILLECTOMY  1951  . US ECHOCARDIOGRAPHY  07/05/2012   mild-mod LA dilation,mild mitral insuff,mild AI    Current Medications: Outpatient Medications Prior to Visit  Medication Sig Dispense Refill  . aspirin 81 MG tablet Take 81 mg by mouth daily.    . cabergoline (DOSTINEX) 0.5 MG tablet Take 0.25 mg by mouth 2 (two) times a week.    Marland Kitchen CALCIUM & MAGNESIUM CARBONATES PO Take 1,000 mg by mouth daily.    . hydrocortisone butyrate (LUCOID) 0.1 % CREA cream Apply topically 2 (two) times daily as needed.     . Multiple Vitamins-Minerals (MULTIVITAMIN WITH MINERALS) tablet Take 1 tablet by mouth daily.    . tazarotene (TAZORAC)  0.05 % cream Apply topically as needed.    . zoster vaccine live, PF, (ZOSTAVAX) 60454 UNT/0.65ML injection Inject 19,400 Units into the skin once. Administer at pharmacy 1 each 0  . cholecalciferol (VITAMIN D) 1000 UNITS tablet Take 2,000 Units by mouth daily.      No facility-administered medications prior to visit.      Allergies:   Sulfamethoxazole and Sulfonamide derivatives   Social History   Social History  . Marital status: Married    Spouse name: N/A  . Number of children: N/A  . Years of education: N/A   Social History Main Topics  . Smoking status: Never Smoker  . Smokeless tobacco: Never Used  . Alcohol use 0.0 oz/week     Comment: 4 glasses of wine a week  . Drug use: No  . Sexual activity: Not Asked   Other Topics Concern  . None   Social History Narrative  . None     Family History:  The patient's family history includes Heart failure in his mother; Stroke in his father and mother.   ROS:   Please see the history of present illness.    ROS All other systems reviewed and are negative.   PHYSICAL EXAM:   VS:  BP 111/75   Pulse 64   Ht 6' (1.829 m)   Wt 78 kg (172 lb)   BMI 23.33 kg/m    GEN: Well nourished, well developed, in  no acute distress  HEENT: normal  Neck: no JVD, carotid bruits, or masses Cardiac: RRR; no murmurs, rubs, or gallops,no edema , faint midsystolic click, cannot hear murmur even following the Valsalva maneuver Respiratory:  clear to auscultation bilaterally, normal work of breathing GI: soft, nontender, nondistended, + BS MS: no deformity or atrophy  Skin: warm and dry, no rash Neuro:  Alert and Oriented x 3, Strength and sensation are intact Psych: euthymic mood, full affect  Wt Readings from Last 3 Encounters:  06/02/16 78 kg (172 lb)  12/22/15 76.2 kg (168 lb)  10/10/15 76.2 kg (168 lb)      Studies/Labs Reviewed:   EKG:  EKG is ordered today.  The ekg ordered today demonstrates Normal sinus rhythm, voltage  criteria only for LVH (like dermatitis on the body habitus rather than true LVH  Recent Labs: No results found for requested labs within last 8760 hours.   Lipid Panel    Component Value Date/Time   CHOL 231 (H) 07/31/2014 1735   TRIG 233 (H) 07/31/2014 1735   HDL 46 07/31/2014 1735   CHOLHDL 5.0 07/31/2014 1735   VLDL 47 (H) 07/31/2014 1735   LDLCALC 138 (H) 07/31/2014 1735     ASSESSMENT:    1. MVP (mitral valve prolapse)   2. PVCs (premature ventricular contractions)   3. Mixed hyperlipidemia      PLAN:  In order of problems listed above:  1. MVP: Conflicting echo evidence for this diagnosis. He does not have mitral insufficiency either by physical exam or by echocardiography so further evaluation does not appear to be necessary 2. PVCs: These are infrequent and not particularly symptomatic. No specific treatment recommended 3. HLP: Do not have a updated lipid profile. He is planning to have this checked with his new primary care provider. Since he does not have known coronary/peripheral vascular disease would not recommend pharmacological therapy. Continue exercise and dietary discipline.    Medication Adjustments/Labs and Tests Ordered: Current medicines are reviewed at length with the patient today.  Concerns regarding medicines are outlined above.  Medication changes, Labs and Tests ordered today are listed in the Patient Instructions below. Patient Instructions  Dr [name] recommends that you schedule a follow-up appointment in 1 year. You will receive a reminder letter in the mail two months in advance. If you don't receive a letter, please call our office to schedule the follow-up appointment.  If you need a refill on your cardiac medications before your next appointment, please call your pharmacy.    Signed, Sanda Klein, MD  06/02/2016 5:32 PM    [address], [place], [place]  [address] Phone: [phone]; Fax: [phone]

## 2016-08-05 ENCOUNTER — Telehealth: Payer: Self-pay | Admitting: Gastroenterology

## 2016-08-05 NOTE — Telephone Encounter (Signed)
Former Dr. Sharlett Iles patient. Patient states that he currently sees Dr. Earlean Shawl but wants to transfer back to our office because his wife is a patient of Dr. [name]. Patient will have records faxed to our office for review.

## 2016-08-20 NOTE — Telephone Encounter (Signed)
Received patient GI records from [facility]. Patient wanting office visit to discuss next colon and further GI issues. Placed on Dr.Danis's desk for review

## 2016-08-28 NOTE — Telephone Encounter (Signed)
Dr.Danis accepted patient and said okay to sch if having GI problems. Also need 2015 path report of polyps to know when next colon should be

## 2016-09-10 ENCOUNTER — Encounter: Payer: Self-pay | Admitting: Gastroenterology

## 2016-09-12 NOTE — Telephone Encounter (Signed)
Appointment scheduled.

## 2016-09-27 ENCOUNTER — Ambulatory Visit (HOSPITAL_COMMUNITY)
Admission: EM | Admit: 2016-09-27 | Discharge: 2016-09-27 | Disposition: A | Discharge status: Home / Self Care | Payer: Medicare Other | Attending: Family Medicine | Admitting: Family Medicine

## 2016-09-27 ENCOUNTER — Encounter (HOSPITAL_COMMUNITY): Payer: Self-pay | Admitting: *Deleted

## 2016-09-27 DIAGNOSIS — R69 Illness, unspecified: Secondary | ICD-10-CM | POA: Diagnosis not present

## 2016-09-27 DIAGNOSIS — J111 Influenza due to unidentified influenza virus with other respiratory manifestations: Secondary | ICD-10-CM

## 2016-09-27 MED ORDER — OSELTAMIVIR PHOSPHATE 75 MG PO CAPS: 75.0000 mg | ORAL_CAPSULE | Freq: Two times a day (BID) | ORAL | 0 refills | Status: AC

## 2016-09-27 MED ORDER — HYDROCOD POLST-CPM POLST ER 10-8 MG/5ML PO SUER: 5.0000 mL | Freq: Two times a day (BID) | ORAL | 0 refills | Status: AC | PRN

## 2016-09-27 MED ORDER — AZITHROMYCIN 250 MG PO TABS: 250.0000 mg | ORAL_TABLET | Freq: Every day | ORAL | 0 refills | Status: AC

## 2016-09-27 MED ORDER — AZITHROMYCIN 250 MG PO TABS: 250.0000 mg | ORAL_TABLET | Freq: Every day | ORAL | 0 refills | Status: DC

## 2016-09-27 NOTE — ED Triage Notes (Signed)
Pt        Reports  Cough    Low  Grade       Cough  Is  Non  Productive        Body  Aches    Chills        Symptoms  X   2  Days       Tylenol   Today         Drinking  Lots  Of  Fluid

## 2016-09-27 NOTE — ED Provider Notes (Signed)
CSN: [id number]     Arrival date & time 09/27/16  1848 History   First MD Initiated Contact with Patient 09/27/16 2033     Chief Complaint  Patient presents with  . Cough   (Consider location/radiation/quality/duration/timing/severity/associated sxs/prior Treatment) Patient presents today with sudden onset on Thursday morning of coughing, running nose, chest congestion, temp of 99.0 and symptoms got progressively worse on Thursday. Symptoms continues to get worse on Friday with more coughing,   More chest congestion, and a fever despite taking tylenol. Today, fever went up to 100.4 and fever is present after taking 2 tylenol tablets.  Patient states that he "feels run down". Reports his 2 y.o. Grandchildren is sick with similar symptoms. Besides tylenol, patient have not tried anything else.    Cough  Associated symptoms: chills, fever and rhinorrhea   Associated symptoms: no chest pain, no ear pain, no headaches, no myalgias, no shortness of breath, no sore throat and no wheezing     Past Medical History:  Diagnosis Date  . Hyperlipidemia   . Hypoprolactinemia ([place])   . Osteoporosis   . Palpitations    Past Surgical History:  Procedure Laterality Date  . CARDIOVASCULAR STRESS TEST  04/23/2011   negative  . FACIAL RECONSTRUCTION SURGERY  1996  . [place]  . TONSILLECTOMY  1951  . US ECHOCARDIOGRAPHY  07/05/2012   mild-mod LA dilation,mild mitral insuff,mild AI   Family History  Problem Relation Age of Onset  . Heart failure Mother   . Stroke Mother   . Stroke Father    Social History  Substance Use Topics  . Smoking status: Never Smoker  . Smokeless tobacco: Never Used  . Alcohol use 0.0 oz/week     Comment: 4 glasses of wine a week    Review of Systems  Constitutional: Positive for chills, fatigue and fever.  HENT: Positive for congestion, rhinorrhea and sneezing. Negative for ear pain and sore throat.   Respiratory: Positive for cough. Negative for  shortness of breath and wheezing.   Cardiovascular: Negative for chest pain and palpitations.  Gastrointestinal: Negative for abdominal pain, nausea and vomiting.  Musculoskeletal: Negative for myalgias.  Neurological: Negative for dizziness and headaches.    Allergies  Sulfamethoxazole and Sulfonamide derivatives  Home Medications   Prior to Admission medications   Medication Sig Start Date End Date Taking? Authorizing Provider  aspirin 81 MG tablet Take 81 mg by mouth daily.    Historical Provider, MD  azithromycin (ZITHROMAX) 250 MG tablet Take 1 tablet (250 mg total) by mouth daily. Take first 2 tablets together, then 1 every day until finished. 09/27/16 10/02/16  Barry Dienes, NP  cabergoline (DOSTINEX) 0.5 MG tablet Take 0.25 mg by mouth 2 (two) times a week.    Historical Provider, MD  CALCIUM & MAGNESIUM CARBONATES PO Take 1,000 mg by mouth daily.    Historical Provider, MD  chlorpheniramine-HYDROcodone (TUSSIONEX PENNKINETIC ER) 10-8 MG/5ML SUER Take 5 mLs by mouth every 12 (twelve) hours as needed for cough. 09/27/16 10/02/16  Barry Dienes, NP  Cholecalciferol (D 2000) 2000 units TABS Take by mouth.    Historical Provider, MD  Cholecalciferol (VITAMIN D3) 1000 units CAPS Take by mouth.    Historical Provider, MD  hydrocortisone butyrate (LUCOID) 0.1 % CREA cream Apply topically 2 (two) times daily as needed.     Historical Provider, MD  Multiple Vitamins-Minerals (MULTIVITAMIN WITH MINERALS) tablet Take 1 tablet by mouth daily.    Historical Provider, MD  oseltamivir (  TAMIFLU) 75 MG capsule Take 1 capsule (75 mg total) by mouth 2 (two) times daily. 09/27/16 10/02/16  Barry Dienes, NP  tazarotene (TAZORAC) 0.05 % cream Apply topically as needed.    Historical Provider, MD  zoster vaccine live, PF, (ZOSTAVAX) 29562 UNT/0.65ML injection Inject 19,400 Units into the skin once. Administer at pharmacy 12/22/15   Leandrew Koyanagi, MD   Meds Ordered and Administered this Visit  Medications - No data  to display  BP 122/73 (BP Location: Right Arm)   Pulse 76   Temp 99.6 F (37.6 C) (Oral)   Resp 18   SpO2 100%  No data found.   Physical Exam  Constitutional: He is oriented to person, place, and time. He appears well-developed and well-nourished.  HENT:  Head: Normocephalic and atraumatic.  Right Ear: External ear normal.  Left Ear: External ear normal.  Nose: Nose normal.  Mouth/Throat: Oropharynx is clear and moist. No oropharyngeal exudate.  TM pearly gray with no erythema  Eyes: Conjunctivae are normal. Pupils are equal, round, and reactive to light.  Neck: Normal range of motion.  Cardiovascular: Normal rate, regular rhythm and normal heart sounds.   Pulmonary/Chest: Effort normal and breath sounds normal. No respiratory distress. He has no wheezes.  Abdominal: Soft. Bowel sounds are normal. He exhibits no distension. There is no tenderness. There is no guarding.  Lymphadenopathy:    He has no cervical adenopathy.  Neurological: He is alert and oriented to person, place, and time.  Skin: Skin is warm and dry.  Nursing note and vitals reviewed.   Urgent Care Course     Procedures (including critical care time)  Labs Review Labs Reviewed - No data to display  Imaging Review No results found.  MDM   1. Influenza-like illness    Patient has flu-like symptoms. Onset was sudden. Will treat presumptively for influenza.  1) Start Tamiflu BID x 5 days 2) Start Tussionex for cough 3) Oral hydration and plenty of rest 4) Use ibuprofen or/and tylenol for fever 5) Use Afrin nasal spray for decongestant 6) Start z-pak if cough gets worst or do not improve by the end of next week.     Barry Dienes, NP 09/27/16 [phone]

## 2016-10-28 ENCOUNTER — Ambulatory Visit (INDEPENDENT_AMBULATORY_CARE_PROVIDER_SITE_OTHER): Payer: Medicare Other | Admitting: Gastroenterology

## 2016-10-28 ENCOUNTER — Encounter: Payer: Self-pay | Admitting: Gastroenterology

## 2016-10-28 VITALS — BP 110/60 | HR 88 | Ht 71.0 in | Wt 165.1 lb

## 2016-10-28 DIAGNOSIS — Z86010 Personal history of colonic polyps: Secondary | ICD-10-CM

## 2016-10-28 DIAGNOSIS — K219 Gastro-esophageal reflux disease without esophagitis: Secondary | ICD-10-CM | POA: Diagnosis not present

## 2016-10-28 MED ORDER — NA SULFATE-K SULFATE-MG SULF 17.5-3.13-1.6 GM/177ML PO SOLN: 1.0000 | Freq: Once | ORAL | 0 refills | Status: AC

## 2016-10-28 NOTE — Progress Notes (Signed)
[facility] Consult Note:  History: Paul King 10/28/2016  Referring physician: Leandrew Koyanagi, MD (Inactive)  Reason for consult/chief complaint: hx of colon polyps (to [facility] care)   Subjective  HPI:  This is a 70 year old man transferring care from Dr. Earlean Shawl, but he also previously saw Dr. Sharlett Iles as far back as 2005 for GERD. An upper endoscopy 2005 showed a small stricture dilated with 56 French Maloney dilator. No Barrett's esophagus was seen. He reports that he was on Prevacid for about 5 years but was able to stop it once he was discovered to have osteoporosis. He has occasional reflux symptoms with pyrosis or regurgitation that he does not feel the need to take medicines for. He denies dysphagia nausea vomiting early satiety or weight loss. His bowel habits are regular without rectal bleeding. Colonoscopy with Dr. Earlean Shawl in 2012 showed adenomatous polyps, one of them 10 mm. He had a repeat colonoscopy in August 2015 with 2 cm diminutive cecal serrated polyps, 3 additional tubular adenomas, all of which were less than 10 mm in diameter. He was advised by Dr. Earlean Shawl to come back in 5 years for surveillance colonoscopy. Paul King was concerned because he thought this might be too long interval, and was especially concerned because his wife have had colon cancer.  ROS:  Review of Systems He denies chest pain dyspnea or dysuria  Past Medical History: Past Medical History:  Diagnosis Date  . Adenomatous colon polyp    tubular  . Basal cell carcinoma   . Hyperlipidemia   . Hypoprolactinemia ([place])   . Melanoma ([place])   . Osteoporosis   . Palpitations      Past Surgical History: Past Surgical History:  Procedure Laterality Date  . CARDIOVASCULAR STRESS TEST  04/23/2011   negative  . FACIAL RECONSTRUCTION SURGERY  1996  . [place]  . TONSILLECTOMY  1951  . US ECHOCARDIOGRAPHY  07/05/2012   mild-mod LA dilation,mild mitral insuff,mild AI      Family History: Family History  Problem Relation Age of Onset  . Heart failure Mother   . Stroke Mother   . Hypertension Mother   . Stroke Father   . Stroke Maternal Grandmother   . Leukemia Maternal Grandfather     Social History: Social History   Social History  . Marital status: Married    Spouse name: N/A  . Number of children: 2  . Years of education: N/A   Occupational History  . retired    Social History Main Topics  . Smoking status: Never Smoker  . Smokeless tobacco: Never Used  . Alcohol use 0.0 oz/week     Comment: 4 glasses of wine a week  . Drug use: No  . Sexual activity: Not Asked   Other Topics Concern  . None   Social History Narrative  . 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 Allergies: Allergies  Allergen Reactions  . Sulfamethoxazole Shortness Of Breath  . Sulfonamide Derivatives     Outpatient Meds: Current Outpatient Prescriptions  Medication Sig Dispense Refill  . aspirin 81 MG tablet Take 81 mg by mouth daily.    . cabergoline (DOSTINEX) 0.5 MG tablet Take 0.25 mg by mouth 2 (two) times a week.    Marland Kitchen CALCIUM & MAGNESIUM CARBONATES PO Take 1,000 mg by mouth daily.    . Cholecalciferol (VITAMIN D3) 1000 units CAPS Take by mouth.    . hydrocortisone butyrate (LUCOID) 0.1 %  CREA cream Apply topically 2 (two) times daily as needed.     . Multiple Vitamins-Minerals (MULTIVITAMIN WITH MINERALS) tablet Take 1 tablet by mouth daily.    . tazarotene (TAZORAC) 0.05 % cream Apply topically as needed.    . zoster vaccine live, PF, (ZOSTAVAX) 96295 UNT/0.65ML injection Inject 19,400 Units into the skin once. Administer at pharmacy 1 each 0   No current facility-administered medications for this visit.       ___________________________________________________________________ Objective   Exam:  BP 110/60 (BP Location: Left Arm, Patient Position: Sitting, Cuff Size: Normal)   Pulse 88    Ht 5\' 11"  (1.803 m) Comment: height measured without shoes  Wt 165 lb 2 oz (74.9 kg)   BMI 23.03 kg/m    General: this is a(n) Well-appearing man good muscle mass, normal vocal quality   Eyes: sclera anicteric, no redness  ENT: oral mucosa moist without lesions, no cervical or supraclavicular lymphadenopathy, good dentition  CV: RRR without murmur, S1/S2, no JVD, no peripheral edema  Resp: clear to auscultation bilaterally, normal RR and effort noted  GI: soft, no tenderness, with active bowel sounds. No guarding or palpable organomegaly noted.  Skin; warm and dry, no rash or jaundice noted  Neuro: awake, alert and oriented x 3. Normal gross motor function and fluent speech  Data: Outside records reviewed as noted above  Assessment: No diagnosis found.  GERD, stable. Minor symptoms that do not 8 medicines. No need for EGD, no history Barrett's esophagus History of colon polyps. Based on current surveillance guidelines, he is due for colonoscopy now.  Plan:  Colonoscopy. He is agreeable  The benefits and risks of the planned procedure were described in detail with the patient or (when appropriate) their health care proxy.  Risks were outlined as including, but not limited to, bleeding, infection, perforation, adverse medication reaction leading to cardiac or pulmonary decompensation, or pancreatitis (if ERCP).  The limitation of incomplete mucosal visualization was also discussed.  No guarantees or warranties were given.    Nelida Meuse III  CC: DOOLITTLE, Linton Ham, MD (Inactive)

## 2016-10-28 NOTE — Patient Instructions (Signed)
If you are age 70 or older, your body mass index should be between 23-30. Your Body mass index is 23.03 kg/m. If this is out of the aforementioned range listed, please consider follow up with your Primary Care Provider.  If you are age 32 or younger, your body mass index should be between 19-25. Your Body mass index is 23.03 kg/m. If this is out of the aformentioned range listed, please consider follow up with your Primary Care Provider.   You have been scheduled for a colonoscopy. Please follow written instructions given to you at your visit today.  Please pick up your prep supplies at the pharmacy within the next 1-3 days. If you use inhalers (even only as needed), please bring them with you on the day of your procedure. Your physician has requested that you go to www.startemmi.com and enter the access code given to you at your visit today. This web site gives a general overview about your procedure. However, you should still follow specific instructions given to you by our office regarding your preparation for the procedure.  Thank you for choosing [facility]  Dr Wilfrid Lund III

## 2016-11-27 ENCOUNTER — Encounter: Payer: Self-pay | Admitting: Gastroenterology

## 2016-12-11 ENCOUNTER — Encounter: Payer: Self-pay | Admitting: Gastroenterology

## 2016-12-11 ENCOUNTER — Ambulatory Visit (AMBULATORY_SURGERY_CENTER): Payer: Medicare Other | Admitting: Gastroenterology

## 2016-12-11 VITALS — BP 96/51 | HR 69 | Temp 98.0°F | Resp 11 | Ht 71.0 in | Wt 165.0 lb

## 2016-12-11 DIAGNOSIS — D124 Benign neoplasm of descending colon: Secondary | ICD-10-CM

## 2016-12-11 DIAGNOSIS — Z86010 Personal history of colonic polyps: Secondary | ICD-10-CM

## 2016-12-11 MED ORDER — SODIUM CHLORIDE 0.9 % IV SOLN: 500.0000 mL | INTRAVENOUS | Status: DC

## 2016-12-11 NOTE — Patient Instructions (Signed)
Handout given on polyps  YOU HAD AN ENDOSCOPIC PROCEDURE TODAY: Refer to the procedure report and other information in the discharge instructions given to you for any specific questions about what was found during the examination. If this information does not answer your questions, please call [facility] office at [phone] to clarify.   YOU SHOULD EXPECT: Some feelings of bloating in the abdomen. Passage of more gas than usual. Walking can help get rid of the air that was put into your GI tract during the procedure and reduce the bloating. If you had a lower endoscopy (such as a colonoscopy or flexible sigmoidoscopy) you may notice spotting of blood in your stool or on the toilet paper. Some abdominal soreness may be present for a day or two, also.  DIET: Your first meal following the procedure should be a light meal and then it is ok to progress to your normal diet. A half-sandwich or bowl of soup is an example of a good first meal. Heavy or fried foods are harder to digest and may make you feel nauseous or bloated. Drink plenty of fluids but you should avoid alcoholic beverages for 24 hours. If you had a esophageal dilation, please see attached instructions for diet.    ACTIVITY: Your care partner should take you home directly after the procedure. You should plan to take it easy, moving slowly for the rest of the day. You can resume normal activity the day after the procedure however YOU SHOULD NOT DRIVE, use power tools, machinery or perform tasks that involve climbing or major physical exertion for 24 hours (because of the sedation medicines used during the test).   SYMPTOMS TO REPORT IMMEDIATELY: A gastroenterologist can be reached at any hour. Please call [phone]  for any of the following symptoms:  Following lower endoscopy (colonoscopy, flexible sigmoidoscopy) Excessive amounts of blood in the stool  Significant tenderness, worsening of abdominal pains  Swelling of the abdomen that is  new, acute  Fever of 100 or higher    FOLLOW UP:  If any biopsies were taken you will be contacted by phone or by letter within the next 1-3 weeks. Call [phone]  if you have not heard about the biopsies in 3 weeks.  Please also call with any specific questions about appointments or follow up tests.  

## 2016-12-11 NOTE — Progress Notes (Signed)
Report given to PACU, vss 

## 2016-12-11 NOTE — Op Note (Signed)
[place] Patient Name: Paul King Procedure Date: 12/11/2016 1:22 PM MRN: [id number] Endoscopist: Mallie Mussel L. [name] , MD Age: 71 Referring MD:  Date of Birth: 04-05-1946 Gender: Male Account #: [id number] Procedure:                Colonoscopy Indications:              High risk colon cancer surveillance: Personal                            history of multiple (3 or more) adenomas (SSp x 2 ,                            TA x 3 , all < 1cm in 2015) Medicines:                Monitored Anesthesia Care Procedure:                Pre-Anesthesia Assessment:                           - Prior to the procedure, a History and Physical                            was performed, and patient medications and                            allergies were reviewed. The patient's tolerance of                            previous anesthesia was also reviewed. The risks                            and benefits of the procedure and the sedation                            options and risks were discussed with the patient.                            All questions were answered, and informed consent                            was obtained. Prior Anticoagulants: The patient has                            taken no previous anticoagulant or antiplatelet                            agents. ASA Grade Assessment: II - A patient with                            mild systemic disease. After reviewing the risks                            and benefits, the patient was deemed in  satisfactory condition to undergo the procedure.                           After obtaining informed consent, the colonoscope                            was passed under direct vision. Throughout the                            procedure, the patient's blood pressure, pulse, and                            oxygen saturations were monitored continuously. The                            Colonoscope was introduced through the  anus and                            advanced to the the cecum, identified by                            appendiceal orifice and ileocecal valve. The                            colonoscopy was performed with moderate difficulty                            due to significant looping and a tortuous colon.                            Successful completion of the procedure was aided by                            using manual pressure. The quality of the bowel                            preparation was good. The ileocecal valve,                            appendiceal orifice, and rectum were photographed.                            The quality of the bowel preparation was evaluated                            using the BBPS [facility] Bowel Preparation Scale)                            with scores of: Right Colon = 2, Transverse Colon =                            2 and Left Colon = 2. The total BBPS score equals  6. The bowel preparation used was SUPREP. Scope In: 1:29:10 PM Scope Out: 1:50:30 PM Scope Withdrawal Time: 0 hours 9 minutes 13 seconds  Total Procedure Duration: 0 hours 21 minutes 20 seconds  Findings:                 The perianal and digital rectal examinations were                            normal.                           Multiple diverticula were found in the left colon.                           The colon (entire examined portion) was moderately                            redundant.                           A 2 mm polyp was found in the proximal descending                            colon. The polyp was sessile. The polyp was removed                            with a cold snare. Resection and retrieval were                            complete.                           The exam was otherwise without abnormality on                            direct and retroflexion views. Complications:            No immediate complications. Estimated Blood Loss:      Estimated blood loss: none. Impression:               - Diverticulosis in the left colon.                           - Redundant colon.                           - One 2 mm polyp in the proximal descending colon,                            removed with a cold snare. Resected and retrieved.                           - The examination was otherwise normal on direct                            and retroflexion views. Recommendation:           -  Patient has a contact number available for                            emergencies. The signs and symptoms of potential                            delayed complications were discussed with the                            patient. Return to normal activities tomorrow.                            Written discharge instructions were provided to the                            patient.                           - Resume previous diet.                           - Continue present medications.                           - Await pathology results.                           - Repeat colonoscopy is recommended for                            surveillance. The colonoscopy date will be                            determined after pathology results from today's                            exam become available for review. If adenomatous,                            recall colonoscopy in 5 years. Ih hyperplastic, no                            future surveillance colonoscopy necessary. Henry L. [name], MD 12/11/2016 1:55:32 PM This report has been signed electronically.

## 2016-12-11 NOTE — Progress Notes (Signed)
Called to room to assist during endoscopic procedure.  Patient ID and intended procedure confirmed with present staff. Received instructions for my participation in the procedure from the performing physician.  

## 2016-12-12 ENCOUNTER — Telehealth: Payer: Self-pay

## 2016-12-12 NOTE — Telephone Encounter (Signed)
  Follow up Call-  Call back number 12/11/2016  Post procedure Call Back phone  # [phone]  Permission to leave phone message Yes  Some recent data might be hidden     Patient questions:  Do you have a fever, pain , or abdominal swelling? No. Pain Score  0 *  Have you tolerated food without any problems? Yes.    Have you been able to return to your normal activities? Yes.    Do you have any questions about your discharge instructions: Diet   No. Medications  No. Follow up visit  No.  Do you have questions or concerns about your Care? No.  Actions: * If pain score is 4 or above: No action needed, pain <4.

## 2016-12-21 ENCOUNTER — Encounter: Payer: Self-pay | Admitting: Gastroenterology

## 2016-12-24 ENCOUNTER — Telehealth: Payer: Self-pay | Admitting: Gastroenterology

## 2016-12-24 NOTE — Telephone Encounter (Signed)
Pathology letter reviewed with pt. 

## 2017-05-17 ENCOUNTER — Ambulatory Visit (HOSPITAL_COMMUNITY)
Admission: EM | Admit: 2017-05-17 | Discharge: 2017-05-17 | Disposition: A | Discharge status: Home / Self Care | Payer: Medicare Other | Attending: Family Medicine | Admitting: Family Medicine

## 2017-05-17 ENCOUNTER — Encounter (HOSPITAL_COMMUNITY): Payer: Self-pay | Admitting: Family Medicine

## 2017-05-17 DIAGNOSIS — J209 Acute bronchitis, unspecified: Secondary | ICD-10-CM | POA: Diagnosis not present

## 2017-05-17 MED ORDER — AZITHROMYCIN 250 MG PO TABS: 250.0000 mg | ORAL_TABLET | Freq: Every day | ORAL | 0 refills | Status: DC

## 2017-05-17 MED ORDER — HYDROCODONE-HOMATROPINE 5-1.5 MG/5ML PO SYRP: 5.0000 mL | ORAL_SOLUTION | Freq: Four times a day (QID) | ORAL | 0 refills | Status: DC | PRN

## 2017-05-17 NOTE — ED Provider Notes (Signed)
[place]   [id number] 05/17/17 Arrival Time: 6812   SUBJECTIVE:  Paul King is a 71 y.o. male who presents to the urgent care with complaint of cough.  He started coughing 10 days ago and the symptoms have worsened in the past 4 days considerably, interfering with sleep.  Some ronchi experienced. No known fever or shortness of breath Has had bronchitis in the past.     Past Medical History:  Diagnosis Date  . Adenomatous colon polyp    tubular  . Basal cell carcinoma   . Hyperlipidemia   . Hypoprolactinemia ([place])   . Melanoma ([place])   . Osteopenia 2000   determined via bone scan  . Osteoporosis   . Palpitations    Family History  Problem Relation Age of Onset  . Heart failure Mother   . Stroke Mother   . Hypertension Mother   . Stroke Father   . Stroke Maternal Grandmother   . Leukemia Maternal Grandfather   . Colon cancer Neg Hx   . Colon polyps Neg Hx   . Stomach cancer Neg Hx   . Rectal cancer Neg Hx   . Esophageal cancer Neg Hx    Social History   Social History  . Marital status: Married    Spouse name: N/A  . Number of children: 2  . Years of education: N/A   Occupational History  . retired    Social History Main Topics  . Smoking status: Never Smoker  . Smokeless tobacco: Never Used  . Alcohol use 0.0 oz/week     Comment: 4 glasses of wine a week  . Drug use: No  . Sexual activity: Not on file   Other Topics Concern  . Not on file   Social History Narrative  . No narrative on file   Current Facility-Administered Medications for the 05/17/17 encounter [facility] Encounter)  Medication  . 0.9 %  sodium chloride infusion   No outpatient prescriptions have been marked as taking for the 05/17/17 encounter [facility] Encounter).   Allergies  Allergen Reactions  . Sulfamethoxazole Shortness Of Breath  . Sulfonamide Derivatives       ROS: As per HPI, remainder of ROS negative.   OBJECTIVE:   Vitals:   05/17/17 1218    BP: 126/79  Pulse: 76  Resp: 18  Temp: 98.7 F (37.1 C)  TempSrc: Oral  SpO2: 96%     General appearance: alert; no distress Eyes: PERRL; EOMI; conjunctiva normal HENT: normocephalic; atraumatic; TMs normal, canal normal, external ears normal without trauma; nasal mucosa normal; oral mucosa normal Neck: supple Lungs: ronchi with auscultation bilaterally in posterior lower lung fields. Heart: regular rate and rhythm Back: no CVA tenderness Extremities: no cyanosis or edema; symmetrical with no gross deformities Skin: warm and dry Neurologic: normal gait; grossly normal Psychological: alert and cooperative; normal mood and affect      Labs:  Results for orders placed or performed in visit on 07/31/14  CBC with Differential  Result Value Ref Range   WBC 6.8 4.0 - 10.5 K/uL   RBC 5.07 4.22 - 5.81 MIL/uL   Hemoglobin 14.4 13.0 - 17.0 g/dL   HCT 43.0 39.0 - 52.0 %   MCV 84.8 78.0 - 100.0 fL   MCH 28.4 26.0 - 34.0 pg   MCHC 33.5 30.0 - 36.0 g/dL   RDW 14.2 11.5 - 15.5 %   Platelets 264 150 - 400 K/uL   Neutrophils Relative % 50 43 - 77 %  Neutro Abs 3.4 1.7 - 7.7 K/uL   Lymphocytes Relative 39 12 - 46 %   Lymphs Abs 2.7 0.7 - 4.0 K/uL   Monocytes Relative 8 3 - 12 %   Monocytes Absolute 0.5 0.1 - 1.0 K/uL   Eosinophils Relative 3 0 - 5 %   Eosinophils Absolute 0.2 0.0 - 0.7 K/uL   Basophils Relative 0 0 - 1 %   Basophils Absolute 0.0 0.0 - 0.1 K/uL   Smear Review Criteria for review not met    MPV 9.1 (L) 9.4 - 12.4 fL  Comprehensive metabolic panel  Result Value Ref Range   Sodium 136 135 - 145 mEq/L   Potassium 4.1 3.5 - 5.3 mEq/L   Chloride 100 96 - 112 mEq/L   CO2 31 19 - 32 mEq/L   Glucose, Bld 95 70 - 99 mg/dL   BUN 20 6 - 23 mg/dL   Creat 0.92 0.50 - 1.35 mg/dL   Total Bilirubin 0.6 0.2 - 1.2 mg/dL   Alkaline Phosphatase 59 39 - 117 U/L   AST 19 0 - 37 U/L   ALT 16 0 - 53 U/L   Total Protein 6.9 6.0 - 8.3 g/dL   Albumin 4.2 3.5 - 5.2 g/dL   Calcium  9.3 8.4 - 10.5 mg/dL  Lipid panel  Result Value Ref Range   Cholesterol 231 (H) 0 - 200 mg/dL   Triglycerides 233 (H) <150 mg/dL   HDL 46 >39 mg/dL   Total CHOL/HDL Ratio 5.0 Ratio   VLDL 47 (H) 0 - 40 mg/dL   LDL Cholesterol 138 (H) 0 - 99 mg/dL  TSH  Result Value Ref Range   TSH 1.391 0.350 - 4.500 uIU/mL    Labs Reviewed - No data to display  No results found.     ASSESSMENT & PLAN:  1. Acute bronchitis, unspecified organism     Meds ordered this encounter  Medications  . azithromycin (ZITHROMAX) 250 MG tablet    Sig: Take 1 tablet (250 mg total) by mouth daily. Take first 2 tablets together, then 1 every day until finished.    Dispense:  6 tablet    Refill:  0  . HYDROcodone-homatropine (HYDROMET) 5-1.5 MG/5ML syrup    Sig: Take 5 mLs by mouth every 6 (six) hours as needed for cough.    Dispense:  90 mL    Refill:  0    Reviewed expectations re: course of current medical issues. Questions answered. Outlined signs and symptoms indicating need for more acute intervention. Patient verbalized understanding. After Visit Summary given.    Procedures:      Robyn Haber, MD 05/17/17 1230

## 2017-05-17 NOTE — ED Triage Notes (Signed)
Pt  Reports  A   Cough   With   Congestion    X  4-  5  Days         Cough   Started  Off  Non  Productive  And  Now  Has   Some   Mucous       Slight pain  Only  When  He   Coughs

## 2017-05-17 NOTE — Discharge Instructions (Signed)
This appears to be an uncomplicated case of bronchitis which should clear with the antibiotic and night time cough medicine.  If symptoms fail to improve or worsen, you will need a chest x-ray.

## 2017-05-22 ENCOUNTER — Ambulatory Visit (HOSPITAL_COMMUNITY)
Admission: EM | Admit: 2017-05-22 | Discharge: 2017-05-22 | Disposition: A | Discharge status: Home / Self Care | Payer: Medicare Other | Attending: Internal Medicine | Admitting: Internal Medicine

## 2017-05-22 ENCOUNTER — Other Ambulatory Visit: Payer: Self-pay | Admitting: Internal Medicine

## 2017-05-22 ENCOUNTER — Encounter (HOSPITAL_COMMUNITY): Payer: Self-pay | Admitting: *Deleted

## 2017-05-22 ENCOUNTER — Ambulatory Visit (INDEPENDENT_AMBULATORY_CARE_PROVIDER_SITE_OTHER): Payer: Medicare Other

## 2017-05-22 DIAGNOSIS — R05 Cough: Secondary | ICD-10-CM

## 2017-05-22 DIAGNOSIS — J40 Bronchitis, not specified as acute or chronic: Secondary | ICD-10-CM

## 2017-05-22 DIAGNOSIS — J984 Other disorders of lung: Secondary | ICD-10-CM

## 2017-05-22 DIAGNOSIS — R0602 Shortness of breath: Secondary | ICD-10-CM | POA: Diagnosis not present

## 2017-05-22 DIAGNOSIS — R590 Localized enlarged lymph nodes: Secondary | ICD-10-CM

## 2017-05-22 MED ORDER — PREDNISONE 20 MG PO TABS: 40.0000 mg | ORAL_TABLET | Freq: Every day | ORAL | 0 refills | Status: AC

## 2017-05-22 MED ORDER — ALBUTEROL SULFATE HFA 108 (90 BASE) MCG/ACT IN AERS: 1.0000 | INHALATION_SPRAY | Freq: Four times a day (QID) | RESPIRATORY_TRACT | 0 refills | Status: DC | PRN

## 2017-05-22 NOTE — Discharge Instructions (Signed)
Xray shows left hilar density that will need CT scan follow up. Please follow up with PCP as soon as possible to schedule appointment.   I have given you an albuterol inhaler and short course prednisone to help with your chest congestion and wheezing. You can continue hydromet for cough. Keep hydrated, your urine should be clear to pale yellow in color. Monitor for any worsening of symptoms, chest pain, shortness of breath, wheezing, swelling of the throat, follow up for reevaluation.

## 2017-05-22 NOTE — ED Provider Notes (Signed)
[place]    CSN: [id number] Arrival date & time: 05/22/17  1002     History   Chief Complaint Chief Complaint  Patient presents with  . Cough    HPI Paul King is a 71 y.o. male.   71 year old male with history of hyperlipidemia, hyperprolactinemia, and repeat, comes in for continued chest congestion, coughing after being seen 6 days ago. Patient was seen here, treated for acute bronchitis with azithromycin. Patient states he continues to cough and feel the chest congestion, and now has some shortness of breath and wheezing, particularly at night. Denies fever, chills, night sweats. Denies sore throat, ear pain, eye pain, nasal congestion. Positive sick contact. Denies history of asthma, never smoker.       Past Medical History:  Diagnosis Date  . Adenomatous colon polyp    tubular  . Basal cell carcinoma   . Hyperlipidemia   . Hypoprolactinemia ([place])   . Melanoma ([place])   . Osteopenia 2000   determined via bone scan  . Osteoporosis   . Palpitations     Patient Active Problem List   Diagnosis Date Noted  . PVCs (premature ventricular contractions) 06/02/2016  . MVP (mitral valve prolapse) 10/07/2012  . Palpitations   . Hyperprolactinemia ([place]) 05/17/2012  . Osteoporosis 02/05/2012  . Mixed hyperlipidemia 03/08/2009  . INTERNAL HEMORRHOIDS 03/08/2009  . ESOPHAGEAL STRICTURE 03/08/2009  . GERD 03/08/2009    Past Surgical History:  Procedure Laterality Date  . CARDIOVASCULAR STRESS TEST  04/23/2011   negative  . COLONOSCOPY    . FACIAL RECONSTRUCTION SURGERY  1996  . [place]  . POLYPECTOMY    . TONSILLECTOMY  1951  . US ECHOCARDIOGRAPHY  07/05/2012   mild-mod LA dilation,mild mitral insuff,mild AI       Home Medications    Prior to Admission medications   Medication Sig Start Date End Date Taking? Authorizing Provider  albuterol (PROVENTIL HFA;VENTOLIN HFA) 108 (90 Base) MCG/ACT inhaler Inhale 1-2 puffs into the lungs every  6 (six) hours as needed for wheezing or shortness of breath. 05/22/17   Tasia Catchings, Juergen Hardenbrook V, PA-C  aspirin 81 MG tablet Take 81 mg by mouth daily.    [provider]  cabergoline (DOSTINEX) 0.5 MG tablet Take 0.25 mg by mouth 2 (two) times a week.    [provider]  CALCIUM & MAGNESIUM CARBONATES PO Take 1,000 mg by mouth daily.    [provider]  Cholecalciferol (VITAMIN D3) 1000 units CAPS Take by mouth.    [provider]  HYDROcodone-homatropine (HYDROMET) 5-1.5 MG/5ML syrup Take 5 mLs by mouth every 6 (six) hours as needed for cough. 05/17/17   Robyn Haber, MD  hydrocortisone butyrate (LUCOID) 0.1 % CREA cream Apply topically 2 (two) times daily as needed.     [provider]  Multiple Vitamins-Minerals (MULTIVITAMIN WITH MINERALS) tablet Take 1 tablet by mouth daily.    [provider]  predniSONE (DELTASONE) 20 MG tablet Take 2 tablets (40 mg total) by mouth daily. 05/22/17 05/25/17  Tasia Catchings, Gayl Ivanoff V, PA-C  tazarotene (TAZORAC) 0.05 % cream Apply topically as needed.    [provider]  zoster vaccine live, PF, (ZOSTAVAX) 76811 UNT/0.65ML injection Inject 19,400 Units into the skin once. Administer at pharmacy 12/22/15   Leandrew Koyanagi, MD    Family History Family History  Problem Relation Age of Onset  . Heart failure Mother   . Stroke Mother   . Hypertension Mother   .  Stroke Father   . Stroke Maternal Grandmother   . Leukemia Maternal Grandfather   . Colon cancer Neg Hx   . Colon polyps Neg Hx   . Stomach cancer Neg Hx   . Rectal cancer Neg Hx   . Esophageal cancer Neg Hx     Social History Social History  Substance Use Topics  . Smoking status: Never Smoker  . Smokeless tobacco: Never Used  . Alcohol use 0.0 oz/week     Comment: 4 glasses of wine a week     Allergies   Sulfamethoxazole and Sulfonamide derivatives   Review of Systems Review of Systems  Reason unable to perform ROS: See HPI as above.      Physical Exam Triage Vital Signs ED Triage Vitals  Enc Vitals Group     BP 05/22/17 1030 124/82     Pulse Rate 05/22/17 1030 78     Resp 05/22/17 1030 18     Temp 05/22/17 1030 98.6 F (37 C)     Temp Source 05/22/17 1030 Oral     SpO2 05/22/17 1030 96 %     Weight --      Height --      Head Circumference --      Peak Flow --      Pain Score 05/22/17 1029 2     Pain Loc --      Pain Edu? --      Excl. in [place]? --    No data found.   Updated Vital Signs BP 124/82 (BP Location: Right Arm)   Pulse 78   Temp 98.6 F (37 C) (Oral)   Resp 18   SpO2 96%   Physical Exam  Constitutional: He is oriented to person, place, and time. He appears well-developed and well-nourished. No distress.  HENT:  Head: Normocephalic and atraumatic.  Right Ear: Tympanic membrane, external ear and ear canal normal. Tympanic membrane is not erythematous and not bulging.  Left Ear: Tympanic membrane, external ear and ear canal normal. Tympanic membrane is not erythematous and not bulging.  Nose: Nose normal. Right sinus exhibits no maxillary sinus tenderness and no frontal sinus tenderness. Left sinus exhibits no maxillary sinus tenderness and no frontal sinus tenderness.  Mouth/Throat: Uvula is midline, oropharynx is clear and moist and mucous membranes are normal.  Eyes: Pupils are equal, round, and reactive to light. Conjunctivae are normal.  Neck: Normal range of motion. Neck supple.  Cardiovascular: Normal rate, regular rhythm and normal heart sounds.  Exam reveals no gallop and no friction rub.   No murmur heard. Pulmonary/Chest: Effort normal.  Scattered inspiratory and expiratory wheezing, worse at left lower lobe. Rhonchi in left lower lobe.  Lymphadenopathy:    He has no cervical adenopathy.  Neurological: He is alert and oriented to person, place, and time.  Skin: Skin is warm and dry.  Psychiatric: He has a normal mood and affect. His behavior is normal. Judgment normal.      UC Treatments / Results  Labs (all labs ordered are listed, but only abnormal results are displayed) Labs Reviewed - No data to display  EKG  EKG Interpretation None       Radiology Dg Chest 2 View  Result Date: 05/22/2017 CLINICAL DATA:  Cough, shortness of breath. EXAM: CHEST  2 VIEW COMPARISON:  None. FINDINGS: The heart size and mediastinal contours are within normal limits. No pneumothorax is noted. Significant pleural effusion is noted. Rounded density is seen in right hilar region  which may represent granuloma, but mass or lymph node cannot be excluded. Right lung is clear. Minimal left basilar subsegmental atelectasis is noted. The visualized skeletal structures are unremarkable. IMPRESSION: Minimal left basilar subsegmental atelectasis. Left hilar rounded density is noted ; CT scan of the chest is recommended for further evaluation. Electronically Signed   By: Marijo Conception, M.D.   On: 05/22/2017 10:58    Procedures Procedures (including critical care time)  Medications Ordered in UC Medications - No data to display   Initial Impression / Assessment and Plan / UC Course  I have reviewed the triage vital signs and the nursing notes.  Pertinent labs & imaging results that were available during my care of the patient were reviewed by me and considered in my medical decision making (see chart for details).    Discussed xray results with patient, left hilar density with recommendation of CT scan. Patient states with PCP is Dr Crist Infante, and will prefer to follow up with him for CT scan. Patient states will go to office to make appointment after this visit. He does endorse history of histoplasmosis as a child. Patient can start inhaler and prednisone for wheezing/shortness of breath. Stressed importance of follow up for CT to further evaluate hilar density. Patient to follow up with PCP for further evaluation and CT scan needed.   Case discussed with Dr Valere Dross, who  agrees to plan.   Final Clinical Impressions(s) / UC Diagnoses   Final diagnoses:  Bronchitis  Hilar density    New Prescriptions Discharge Medication List as of 05/22/2017 11:31 AM    START taking these medications   Details  albuterol (PROVENTIL HFA;VENTOLIN HFA) 108 (90 Base) MCG/ACT inhaler Inhale 1-2 puffs into the lungs every 6 (six) hours as needed for wheezing or shortness of breath., Starting Fri 05/22/2017, Normal    predniSONE (DELTASONE) 20 MG tablet Take 2 tablets (40 mg total) by mouth daily., Starting Fri 05/22/2017, Until Mon 05/25/2017, Normal          Cathlean Sauer V, PA-C 05/22/17 1653

## 2017-05-22 NOTE — ED Triage Notes (Signed)
Pt  Reports   Symptoms  Of  Cough   /  Congestion      Seen  ucc  5 days  Ago   For  Same      Pt    Reports       Took  All  Of  His   Anti  Biotics        Still  Having   Symptoms      Sitting  Upright  On  Exam table   Speaking  In  Complete   sentances

## 2017-05-27 ENCOUNTER — Ambulatory Visit
Admission: RE | Admit: 2017-05-27 | Discharge: 2017-05-27 | Disposition: A | Discharge status: Home / Self Care | Payer: Medicare Other | Source: Ambulatory Visit | Attending: Internal Medicine | Admitting: Internal Medicine

## 2017-05-27 DIAGNOSIS — R590 Localized enlarged lymph nodes: Secondary | ICD-10-CM

## 2017-05-27 MED ORDER — IOPAMIDOL (ISOVUE-300) INJECTION 61%
75.0000 mL | Freq: Once | INTRAVENOUS | Status: AC | PRN
  Administered 2017-05-27: 75 mL via INTRAVENOUS

## 2017-06-03 ENCOUNTER — Ambulatory Visit (INDEPENDENT_AMBULATORY_CARE_PROVIDER_SITE_OTHER): Payer: Medicare Other | Admitting: Pulmonary Disease

## 2017-06-03 ENCOUNTER — Encounter: Payer: Self-pay | Admitting: Pulmonary Disease

## 2017-06-03 DIAGNOSIS — J470 Bronchiectasis with acute lower respiratory infection: Secondary | ICD-10-CM | POA: Diagnosis not present

## 2017-06-03 NOTE — Progress Notes (Signed)
Subjective:    Patient ID: Paul King, male    DOB: Dec 17, 1945, 71 y.o.   MRN: [id number]  HPI  71 year old never smoker referred for evaluation of abnormal imaging study showing bronchiectasis.  He reports chest colds about once a year but is not difficult to shake off. He leads cough drops to [place] every year. He had a recent trip to [place] and the left URI symptoms and return, this will progress to a chest cold. He was seen at [facility] urgent care and given an antibiotic. On his second visit chest x-ray was obtained which showed calcified lymphadenopathy, he was also given prednisone and albuterol for wheezing. He followed up with his PCP Dr Joylene Draft, was noted to be wheezing and was given another antibiotic, Omnicef to take for 7 days. He started feeling better 2 days ago and now states that his cough is almost resolved. CT chest was obtained which confirmed 2.7 cm calcified lymph node and showed bronchiectasis most prominent in the left lower lobe with mild scarring at the left base. There was left lower lobe tree-in-bud densities consistent with bronchiolitis.  He reports that his sputum has cleared up. He reports that a density was noted on his chest x-ray back when he was in college. He was disqualified from the enrolling for the [place] [employer] due to this. About 20 years ago he had a skiing injury and the radiologist on reviewing his x-rays said that it may be due to histoplasmosis. He grew up in [place] and has lived in [place] most of his adult life. 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 Adenomatous colon polyp    tubular  . Basal cell carcinoma   . Hyperlipidemia   . Hypoprolactinemia ([place])   . Melanoma ([place])   . Osteopenia 2000   determined via bone scan  . Osteoporosis   . Palpitations      Past  Surgical History:  Procedure Laterality Date  . CARDIOVASCULAR STRESS TEST  04/23/2011   negative  . COLONOSCOPY    . FACIAL RECONSTRUCTION SURGERY  1996  . [place]  . POLYPECTOMY    . TONSILLECTOMY  1951  . US ECHOCARDIOGRAPHY  07/05/2012   mild-mod LA dilation,mild mitral insuff,mild AI    Allergies  Allergen Reactions  . Sulfamethoxazole Shortness Of Breath  . Sulfonamide Derivatives     Social History   Social History  . Marital status: Married    Spouse name: N/A  . Number of children: 2  . Years of education: N/A   Occupational History  . retired    Social History Main Topics  . Smoking status: Never Smoker  . Smokeless tobacco: Never Used  . Alcohol use 0.0 oz/week     Comment: 4 glasses of wine a week  . Drug use: No  . Sexual activity: Not on file   Other Topics Concern  . Not on file   Social History Narrative  . No narrative on file       Family History  Problem Relation Age of Onset  . Heart failure Mother   . Stroke Mother   . Hypertension Mother   . Stroke Father   . Stroke Maternal Grandmother   . Leukemia Maternal Grandfather   . Colon cancer Neg Hx   .  Colon polyps Neg Hx   . Stomach cancer Neg Hx   . Rectal cancer Neg Hx   . Esophageal cancer Neg Hx      Review of Systems Constitutional: negative for anorexia, fevers and sweats  Eyes: negative for irritation, redness and visual disturbance  Ears, nose, mouth, throat, and face: negative for earaches, epistaxis, nasal congestion and sore throat  Respiratory: negative for dyspnea on exertion, sputum and wheezing  Cardiovascular: negative for chest pain, dyspnea, lower extremity edema, orthopnea, palpitations and syncope  Gastrointestinal: negative for abdominal pain, constipation, diarrhea, melena, nausea and vomiting  Genitourinary:negative for dysuria, frequency and hematuria  Hematologic/lymphatic: negative for bleeding, easy bruising and lymphadenopathy    Musculoskeletal:negative for arthralgias, muscle weakness and stiff joints  Neurological: negative for coordination problems, gait problems, headaches and weakness  Endocrine: negative for diabetic symptoms including polydipsia, polyuria and weight loss     Objective:   Physical Exam  Gen. Pleasant, well-nourished, thin, in no distress, normal affect ENT - no lesions, no post nasal drip Neck: No JVD, no thyromegaly, no carotid bruits Lungs: no use of accessory muscles, no dullness to percussion,left Infrascapular rales no rhonchi  Cardiovascular: Rhythm regular, heart sounds  normal, no murmurs or gallops, no peripheral edema Abdomen: soft and non-tender, no hepatosplenomegaly, BS normal. Musculoskeletal: No deformities, no cyanosis or clubbing Neuro:  alert, non focal        Assessment & Plan:

## 2017-06-03 NOTE — Patient Instructions (Signed)
You have mild bronchiectasis especially in the left lower lung This may mean that a chest cold takes longer to clear up - he may need up to 10-14 days of antibiotics should this recur in the future  Let me know if you have any kind of persistent symptoms  You also have a calcified lymph gland in the chest

## 2017-06-03 NOTE — Assessment & Plan Note (Addendum)
mild bronchiectasis especially in the left lower lung This may mean that a chest cold takes longer to clear up - he may need up to 10-14 days of antibiotics should this recur in the future  I offered him spirometry should he develop dyspnea Okay to take flu shot next week  Calcified lymph node is a residual of healed granulomatous disease and may be related to histoplasmosis or other infection

## 2017-07-16 ENCOUNTER — Ambulatory Visit: Payer: Medicare Other | Admitting: Cardiovascular Disease

## 2017-07-16 ENCOUNTER — Encounter: Payer: Self-pay | Admitting: Cardiovascular Disease

## 2017-07-16 VITALS — BP 145/76 | HR 61 | Ht 72.0 in | Wt 165.2 lb

## 2017-07-16 DIAGNOSIS — E782 Mixed hyperlipidemia: Secondary | ICD-10-CM | POA: Diagnosis not present

## 2017-07-16 DIAGNOSIS — I493 Ventricular premature depolarization: Secondary | ICD-10-CM

## 2017-07-16 DIAGNOSIS — I341 Nonrheumatic mitral (valve) prolapse: Secondary | ICD-10-CM | POA: Diagnosis not present

## 2017-07-16 NOTE — Progress Notes (Signed)
Cardiology Office Note    Date:  07/16/2017   ID:  JEANPIERRE King, DOB July 21, 1946, MRN [id number]  PCP:  Crist Infante, MD  Cardiologist:   Sanda Klein, MD   Chief Complaint  Patient presents with  . Follow-up    12 months;    History of Present Illness:  Paul King is a 71 y.o. male with history of PVCs, mixed hyperlipidemia, hyperprolactinemia, questionable diagnosis of mitral valve prolapse returning for follow-up.   His palpitations are barely noticeable, usually only when he is lying in bed.  He remains physically active and will often walk 7 miles in a day, playing golf.  He does not feel limited by shortness of breath, chest pain, dizziness, syncope, claudication and denies leg edema or focal neurological complaints.  He has a new primary care provider, Dr. Ralene Cork.  After having a lipid profile checked this year, he just started taking rosuvastatin, with plans for a follow-up lipid profile early next year.  He is tolerating rosuvastatin without side effects so far.  Past Medical History:  Diagnosis Date  . Adenomatous colon polyp    tubular  . Basal cell carcinoma   . Hyperlipidemia   . Hypoprolactinemia ([place])   . Melanoma ([place])   . Osteopenia 2000   determined via bone scan  . Osteoporosis   . Palpitations     Past Surgical History:  Procedure Laterality Date  . CARDIOVASCULAR STRESS TEST  04/23/2011   negative  . COLONOSCOPY    . FACIAL RECONSTRUCTION SURGERY  1996  . [place]  . POLYPECTOMY    . TONSILLECTOMY  1951  . US ECHOCARDIOGRAPHY  07/05/2012   mild-mod LA dilation,mild mitral insuff,mild AI    Current Medications: Outpatient Medications Prior to Visit  Medication Sig Dispense Refill  . albuterol (PROVENTIL HFA;VENTOLIN HFA) 108 (90 Base) MCG/ACT inhaler Inhale 1-2 puffs into the lungs every 6 (six) hours as needed for wheezing or shortness of breath. 1 Inhaler 0  . aspirin 81 MG tablet Take 81 mg by mouth daily.    .  cabergoline (DOSTINEX) 0.5 MG tablet Take 0.25 mg by mouth 2 (two) times a week.    Marland Kitchen CALCIUM & MAGNESIUM CARBONATES PO Take 1,000 mg by mouth daily.    . cefdinir (OMNICEF) 300 MG capsule TAKE ONE TWICE A DAY  0  . Cholecalciferol (VITAMIN D3) 1000 units CAPS Take by mouth.    Marland Kitchen HYDROcodone-homatropine (HYDROMET) 5-1.5 MG/5ML syrup Take 5 mLs by mouth every 6 (six) hours as needed for cough. 90 mL 0  . hydrocortisone butyrate (LUCOID) 0.1 % CREA cream Apply topically 2 (two) times daily as needed.     . Multiple Vitamins-Minerals (MULTIVITAMIN WITH MINERALS) tablet Take 1 tablet by mouth daily.    . rosuvastatin (CRESTOR) 10 MG tablet daily.  11  . tazarotene (TAZORAC) 0.05 % cream Apply topically as needed.    . zoster vaccine live, PF, (ZOSTAVAX) 93790 UNT/0.65ML injection Inject 19,400 Units into the skin once. Administer at pharmacy 1 each 0   Facility-Administered Medications Prior to Visit  Medication Dose Route Frequency Provider Last Rate Last Dose  . 0.9 %  sodium chloride infusion  500 mL Intravenous Continuous Danis, Estill Cotta III, MD         Allergies:   Sulfamethoxazole and Sulfonamide derivatives   Social History   Socioeconomic History  . Marital status: Married    Spouse name: None  . Number of children: 2  .  Years of education: None  . Highest education level: None  Social Needs  . Financial resource strain: None  . Food insecurity - worry: None  . Food insecurity - inability: None  . Transportation needs - medical: None  . Transportation needs - non-medical: None  Occupational History  . Occupation: retired  Tobacco Use  . Smoking status: Never Smoker  . Smokeless tobacco: Never Used  Substance and Sexual Activity  . Alcohol use: Yes    Alcohol/week: 0.0 oz    Comment: 4 glasses of wine a week  . Drug use: No  . Sexual activity: None  Other Topics Concern  . None  Social History Narrative  . None     Family History:  The patient's family history  includes Heart failure in his mother; Hypertension in his mother; Leukemia in his maternal grandfather; Stroke in his father, maternal grandmother, and mother.   ROS:   Please see the history of present illness.    ROS All other systems reviewed and are negative.   PHYSICAL EXAM:   VS:  BP (!) 145/76   Pulse 61   Ht 6' (1.829 m)   Wt 165 lb 3.2 oz (74.9 kg)   BMI 22.41 kg/m    Recheck blood pressure was normal at 128/79 mmHg  General: Alert, oriented x3, no distress, appears lean and fit Head: no evidence of trauma, PERRL, EOMI, no exophtalmos or lid lag, no myxedema, no xanthelasma; normal ears, nose and oropharynx Neck: normal jugular venous pulsations and no hepatojugular reflux; brisk carotid pulses without delay and no carotid bruits Chest: clear to auscultation, no signs of consolidation by percussion or palpation, normal fremitus, symmetrical and full respiratory excursions Cardiovascular: normal position and quality of the apical impulse, regular rhythm, normal first and second heart sounds, midsystolic click becomes more prominent with the Valsalva maneuver, no murmurs even following provocative maneuvers, rubs or gallops Abdomen: no tenderness or distention, no masses by palpation, no abnormal pulsatility or arterial bruits, normal bowel sounds, no hepatosplenomegaly Extremities: no clubbing, cyanosis or edema; 2+ radial, ulnar and brachial pulses bilaterally; 2+ right femoral, posterior tibial and dorsalis pedis pulses; 2+ left femoral, posterior tibial and dorsalis pedis pulses; no subclavian or femoral bruits Neurological: grossly nonfocal Psych: Normal mood and affect    Wt Readings from Last 3 Encounters:  07/16/17 165 lb 3.2 oz (74.9 kg)  06/03/17 164 lb (74.4 kg)  12/11/16 165 lb (74.8 kg)      Studies/Labs Reviewed:   EKG:  EKG is ordered today.  The ekg ordered today demonstrates normal sinus rhythm, left axis deviation, a pattern approaching but not reaching  the diagnosis of left anterior fascicular block, no acute repolarization abnormalities  Recent Labs: No results found for requested labs within last 8760 hours.   Lipid Panel    Component Value Date/Time   CHOL 231 (H) 07/31/2014 1735   TRIG 233 (H) 07/31/2014 1735   HDL 46 07/31/2014 1735   CHOLHDL 5.0 07/31/2014 1735   VLDL 47 (H) 07/31/2014 1735   LDLCALC 138 (H) 07/31/2014 1735   Labs from March 05, 2017 show normal LFTs, creatinine 0.9, hemoglobin 14.6, total cholesterol 225, HDL 41, LDL 148, triglycerides 182  ASSESSMENT:    1. MVP (mitral valve prolapse)   2. PVC's (premature ventricular contractions)   3. Mixed hyperlipidemia      PLAN:  In order of problems listed above:  1. MVP: He has a fairly distant systolic click, but by previous echo  did not have any significant mitral insufficiency.  Unlikely to be an important problem in the future. 2. PVCs: Infrequent and minimally symptomatic, he does not want to take medications to palliate the symptoms. 3. HLP: LDL-C this year was indeed even higher than in the past.  He already has a healthy lifestyle with good diet and exercise levels. I agree with starting lipid-lowering therapy.  He does not have known vascular disease and a target LDL of less than 100 should be appropriate.  The current dose of rosuvastatin should be sufficient to achieve that goal.    Medication Adjustments/Labs and Tests Ordered: Current medicines are reviewed at length with the patient today.  Concerns regarding medicines are outlined above.  Medication changes, Labs and Tests ordered today are listed in the Patient Instructions below. Patient Instructions  Dr [name] recommends that you schedule a follow-up appointment in 12 months. You will receive a reminder letter in the mail two months in advance. If you don't receive a letter, please call our office to schedule the follow-up appointment.  If you need a refill on your cardiac medications before  your next appointment, please call your pharmacy.     Signed, Sanda Klein, MD  07/16/2017 5:16 PM    [address], [place], [place]  [address] Phone: [phone]; Fax: [phone]

## 2017-07-16 NOTE — Patient Instructions (Signed)
Dr Croitoru recommends that you schedule a follow-up appointment in 12 months. You will receive a reminder letter in the mail two months in advance. If you don't receive a letter, please call our office to schedule the follow-up appointment.  If you need a refill on your cardiac medications before your next appointment, please call your pharmacy. 

## 2017-09-14 ENCOUNTER — Encounter: Payer: Self-pay | Admitting: Pulmonary Disease

## 2017-09-14 ENCOUNTER — Ambulatory Visit: Payer: Medicare Other | Admitting: Pulmonary Disease

## 2017-09-14 VITALS — BP 112/74 | HR 81 | Ht 72.0 in | Wt 170.4 lb

## 2017-09-14 DIAGNOSIS — J471 Bronchiectasis with (acute) exacerbation: Secondary | ICD-10-CM

## 2017-09-14 MED ORDER — DOXYCYCLINE HYCLATE 100 MG PO TABS: 100.0000 mg | ORAL_TABLET | Freq: Two times a day (BID) | ORAL | 0 refills | Status: DC

## 2017-09-14 NOTE — Progress Notes (Signed)
   Subjective:    Patient ID: Paul King, male    DOB: 1945-09-16, 72 y.o.   MRN: [id number]  HPI  72 year old never smoker for FU  bronchiectasis.  He contracted a head cold from his grandson which then developed into a chest cold, he is having green phlegm draining from his nose and his chest. Breathing is at its baseline, no wheezing, no fevers Given his long course when he last had a chest cold in October 2018, he would like to get treated early   Significant tests/ events reviewed  CT chest  05/2017 2.7 cm calcified lymph node and showed bronchiectasis most prominent in the left lower lobe with mild scarring at the left base. There was left lower lobe tree-in-bud densities consistent with bronchiolitis. ? H/o histo     Review of Systems Patient denies significant dyspnea,cough, hemoptysis,  chest pain, palpitations, pedal edema, orthopnea, paroxysmal nocturnal dyspnea, lightheadedness, nausea, vomiting, abdominal or  leg pains      Objective:   Physical Exam  Gen. Pleasant, well-nourished, in no distress ENT - no thrush, no post nasal drip Neck: No JVD, no thyromegaly, no carotid bruits Lungs: no use of accessory muscles, no dullness to percussion, clear without rales or rhonchi  Cardiovascular: Rhythm regular, heart sounds  normal, no murmurs or gallops, no peripheral edema Musculoskeletal: No deformities, no cyanosis or clubbing         Assessment & Plan:

## 2017-09-14 NOTE — Assessment & Plan Note (Signed)
Doxycycline 100 mg twice daily for 10 days  Mucinex 600 twice daily for mucus clearance

## 2017-09-14 NOTE — Patient Instructions (Signed)
Doxycycline 100 mg twice daily for 10 days Called me if sputum is still green at that point

## 2018-04-01 ENCOUNTER — Other Ambulatory Visit: Payer: Self-pay | Admitting: Internal Medicine

## 2018-04-01 DIAGNOSIS — M8580 Other specified disorders of bone density and structure, unspecified site: Secondary | ICD-10-CM

## 2018-05-17 ENCOUNTER — Ambulatory Visit
Admission: RE | Admit: 2018-05-17 | Discharge: 2018-05-17 | Disposition: A | Discharge status: Home / Self Care | Payer: Medicare Other | Source: Ambulatory Visit | Attending: Internal Medicine | Admitting: Internal Medicine

## 2018-05-17 DIAGNOSIS — M8580 Other specified disorders of bone density and structure, unspecified site: Secondary | ICD-10-CM

## 2018-07-12 ENCOUNTER — Ambulatory Visit: Payer: Medicare Other | Admitting: Cardiovascular Disease

## 2018-07-12 ENCOUNTER — Encounter: Payer: Self-pay | Admitting: Cardiovascular Disease

## 2018-07-12 VITALS — BP 140/84 | HR 66 | Ht 71.0 in | Wt 175.6 lb

## 2018-07-12 DIAGNOSIS — I493 Ventricular premature depolarization: Secondary | ICD-10-CM | POA: Diagnosis not present

## 2018-07-12 DIAGNOSIS — E7800 Pure hypercholesterolemia, unspecified: Secondary | ICD-10-CM

## 2018-07-12 NOTE — Progress Notes (Signed)
Cardiology Office Note    Date:  07/12/2018   ID:  Paul King, DOB [birth year], MRN [id number]  PCP:  Paul Infante, MD  Cardiologist:   Sanda Klein, MD   No chief complaint on file.   History of Present Illness:  Paul King is a 72 y.o. male with history of PVCs, mixed hyperlipidemia, hyperprolactinemia, questionable diagnosis of mitral valve prolapse returning for follow-up.   He is done quite well since his last appointment without new medical problems or hospitalizations.  His palpitations have become fewer and fewer and are now barely noticeable.  He plays golf several times a week, often walking more than 6 miles.  The patient specifically denies any chest pain at rest exertion, dyspnea at rest or with exertion, orthopnea, paroxysmal nocturnal dyspnea, syncope, focal neurological deficits, intermittent claudication, lower extremity edema, unexplained weight gain, cough, hemoptysis or wheezing.  On his current dose of rosuvastatin his LDL cholesterol was 96.  Past Medical History:  Diagnosis Date  . Adenomatous colon polyp    tubular  . Basal cell carcinoma   . Hyperlipidemia   . Hypoprolactinemia ([place])   . Melanoma ([place])   . Osteopenia 2000   determined via bone scan  . Osteoporosis   . Palpitations     Past Surgical History:  Procedure Laterality Date  . CARDIOVASCULAR STRESS TEST  04/23/2011   negative  . COLONOSCOPY    . FACIAL RECONSTRUCTION SURGERY  1996  . [place]  . POLYPECTOMY    . TONSILLECTOMY  1951  . US ECHOCARDIOGRAPHY  07/05/2012   mild-mod LA dilation,mild mitral insuff,mild AI    Current Medications: Outpatient Medications Prior to Visit  Medication Sig Dispense Refill  . aspirin 81 MG tablet Take 81 mg by mouth daily.    . cabergoline (DOSTINEX) 0.5 MG tablet Take 0.25 mg by mouth 2 (two) times a week.    Marland Kitchen CALCIUM & MAGNESIUM CARBONATES PO Take 1,000 mg by mouth daily.    . Cholecalciferol (VITAMIN D3) 1000 units CAPS  Take by mouth.    . hydrocortisone butyrate (LUCOID) 0.1 % CREA cream Apply topically 2 (two) times daily as needed.     . Multiple Vitamins-Minerals (MULTIVITAMIN WITH MINERALS) tablet Take 1 tablet by mouth daily.    . rosuvastatin (CRESTOR) 20 MG tablet Take 20 mg by mouth daily.  3  . tazarotene (TAZORAC) 0.05 % cream Apply topically as needed.    . zoster vaccine live, PF, (ZOSTAVAX) 50539 UNT/0.65ML injection Inject 19,400 Units into the skin once. Administer at pharmacy 1 each 0  . doxycycline (VIBRA-TABS) 100 MG tablet Take 1 tablet (100 mg total) by mouth 2 (two) times daily. 20 tablet 0  . rosuvastatin (CRESTOR) 10 MG tablet daily.  11   No facility-administered medications prior to visit.      Allergies:   Sulfamethoxazole and Sulfonamide derivatives   Social History   Socioeconomic History  . Marital status: Married    Spouse name: Not on file  . Number of children: 2  . Years of education: Not on file  . Highest education level: Not on file  Occupational History  . Occupation: retired  Scientific laboratory technician  . Financial resource strain: Not on file  . Food insecurity:    Worry: Not on file    Inability: Not on file  . Transportation needs:    Medical: Not on file    Non-medical: Not on file  Tobacco Use  . Smoking  status: Never Smoker  . Smokeless tobacco: Never Used  Substance and Sexual Activity  . Alcohol use: Yes    Alcohol/week: 0.0 standard drinks    Comment: 4 glasses of wine a week  . Drug use: No  . Sexual activity: Not on file  Lifestyle  . Physical activity:    Days per week: Not on file    Minutes per session: Not on file  . Stress: Not on file  Relationships  . Social connections:    Talks on phone: Not on file    Gets together: Not on file    Attends religious service: Not on file    Active member of club or organization: Not on file    Attends meetings of clubs or organizations: Not on file    Relationship status: Not on file  Other Topics  Concern  . Not on file  Social History Narrative  . Not on file     Family History:  The patient's family history includes Heart failure in his mother; Hypertension in his mother; Leukemia in his maternal grandfather; Stroke in his father, maternal grandmother, and mother.   ROS:   Please see the history of present illness.    ROS All other systems reviewed and are negative.   PHYSICAL EXAM:   VS:  BP 140/84   Pulse 66   Ht 5\' 11"  (1.803 m)   Wt 175 lb 9.6 oz (79.7 kg)   BMI 24.49 kg/m      General: Alert, oriented x3, no distress, appears lean and fit Head: no evidence of trauma, PERRL, EOMI, no exophtalmos or lid lag, no myxedema, no xanthelasma; normal ears, nose and oropharynx Neck: normal jugular venous pulsations and no hepatojugular reflux; brisk carotid pulses without delay and no carotid bruits Chest: clear to auscultation, no signs of consolidation by percussion or palpation, normal fremitus, symmetrical and full respiratory excursions Cardiovascular: normal position and quality of the apical impulse, regular rhythm, normal first and second heart sounds, no murmurs, rubs or gallops Abdomen: no tenderness or distention, no masses by palpation, no abnormal pulsatility or arterial bruits, normal bowel sounds, no hepatosplenomegaly Extremities: no clubbing, cyanosis or edema; 2+ radial, ulnar and brachial pulses bilaterally; 2+ right femoral, posterior tibial and dorsalis pedis pulses; 2+ left femoral, posterior tibial and dorsalis pedis pulses; no subclavian or femoral bruits Neurological: grossly nonfocal Psych: Normal mood and affect   Wt Readings from Last 3 Encounters:  07/12/18 175 lb 9.6 oz (79.7 kg)  09/14/17 170 lb 6.4 oz (77.3 kg)  07/16/17 165 lb 3.2 oz (74.9 kg)      Studies/Labs Reviewed:   EKG:  EKG is ordered today.  Shows normal sinus rhythm and does not show the previous left axis deviation a year ago.  There are no repolarization abnormalities, QTC  429 ms.  Recent Labs: Dr. Joylene Draft labs from August 2019, Total cholesterol 157, HDL 47, LDL 96, triglycerides 70 Normal liver function test, BUN 22, creatinine 0.9, glucose 93  Lipid Panel    Component Value Date/Time   CHOL 231 (H) 07/31/2014 1735   TRIG 233 (H) 07/31/2014 1735   HDL 46 07/31/2014 1735   CHOLHDL 5.0 07/31/2014 1735   VLDL 47 (H) 07/31/2014 1735   LDLCALC 138 (H) 07/31/2014 1735   Labs from March 05, 2017 show normal LFTs, creatinine 0.9, hemoglobin 14.6, total cholesterol 225, HDL 41, LDL 148, triglycerides 182  ASSESSMENT:    1. PVCs (premature ventricular contractions)   2. Hypercholesterolemia  PLAN:  In order of problems listed above:   1. PVCs: No recent symptoms of palpitations 2. HLP: LDL cholesterol at goal on current dose of rosuvastatin, without side effects    Medication Adjustments/Labs and Tests Ordered: Current medicines are reviewed at length with the patient today.  Concerns regarding medicines are outlined above.  Medication changes, Labs and Tests ordered today are listed in the Patient Instructions below. Patient Instructions  Medication Instructions:  Dr [name] recommends that you continue on your current medications as directed. Please refer to the Current Medication list given to you today.  If you need a refill on your cardiac medications before your next appointment, please call your pharmacy.   Follow-Up: At [facility], you and your health needs are our priority.  As part of our continuing mission to provide you with exceptional heart care, we have created designated Provider Care Teams.  These Care Teams include your primary Cardiologist (physician) and Advanced Practice Providers (APPs -  Physician Assistants and Nurse Practitioners) who all work together to provide you with the care you need, when you need it. You will need a follow up appointment in 12 months.  Please call our office 2 months in advance to schedule  this appointment.  You may see Sanda Klein, MD or one of the following Advanced Practice Providers on your designated Care Team: [place], [place] . Fabian Sharp, PA-C    Signed, Sanda Klein, MD  07/12/2018 5:07 PM    [facility] [place], [place], [place]  [address] Phone: [phone]; Fax: [phone]

## 2018-07-12 NOTE — Patient Instructions (Signed)
Medication Instructions:  Dr Croitoru recommends that you continue on your current medications as directed. Please refer to the Current Medication list given to you today.  If you need a refill on your cardiac medications before your next appointment, please call your pharmacy.   Follow-Up: At [facility], you and your health needs are our priority.  As part of our continuing mission to provide you with exceptional heart care, we have created designated Provider Care Teams.  These Care Teams include your primary Cardiologist (physician) and Advanced Practice Providers (APPs -  Physician Assistants and Nurse Practitioners) who all work together to provide you with the care you need, when you need it. You will need a follow up appointment in 12 months.  Please call our office 2 months in advance to schedule this appointment.  You may see Mihai Croitoru, MD or one of the following Advanced Practice Providers on your designated Care Team: Hao Meng, PA-C . Angela Duke, PA-C 

## 2019-07-19 ENCOUNTER — Encounter: Payer: Self-pay | Admitting: Cardiovascular Disease

## 2019-07-19 ENCOUNTER — Other Ambulatory Visit: Payer: Self-pay

## 2019-07-19 ENCOUNTER — Ambulatory Visit: Payer: Medicare Other | Admitting: Cardiovascular Disease

## 2019-07-19 VITALS — BP 108/70 | HR 70 | Temp 97.3°F | Ht 72.0 in | Wt 164.0 lb

## 2019-07-19 DIAGNOSIS — E7800 Pure hypercholesterolemia, unspecified: Secondary | ICD-10-CM | POA: Diagnosis not present

## 2019-07-19 DIAGNOSIS — I493 Ventricular premature depolarization: Secondary | ICD-10-CM | POA: Diagnosis not present

## 2019-07-19 NOTE — Progress Notes (Signed)
Cardiology Office Note    Date:  07/19/2019   ID:  Paul King, DOB October 11, 1945, MRN [id number]  PCP:  Crist Infante, MD  Cardiologist:   Sanda Klein, MD   Chief Complaint  Patient presents with  . Follow-up    12 months.    History of Present Illness:  Paul King is a 73 y.o. male with history of PVCs, mixed hyperlipidemia, hyperprolactinemia, questionable diagnosis of mitral valve prolapse returning for follow-up.   He is done quite well since his last appointment without new medical problems or hospitalizations.  He has very rare palpitations, only noticeable at night when he turns onto his side.  He had some PVCs during our exam today, but was unaware of them.  Typically walks at least 6 miles a week playing golf and also walks another mile or 2 for exercise.  The patient specifically denies any chest pain at rest exertion, dyspnea at rest or with exertion, orthopnea, paroxysmal nocturnal dyspnea, syncope, focal neurological deficits, intermittent claudication, lower extremity edema, unexplained weight gain, cough, hemoptysis or wheezing.  On his current dose of rosuvastatin his LDL cholesterol was improved at 83.  Dr. Joylene Draft suggested discontinuation of aspirin for primary prevention.  Past Medical History:  Diagnosis Date  . Adenomatous colon polyp    tubular  . Basal cell carcinoma   . Hyperlipidemia   . Hypoprolactinemia ([place])   . Melanoma ([place])   . Osteopenia 2000   determined via bone scan  . Osteoporosis   . Palpitations     Past Surgical History:  Procedure Laterality Date  . CARDIOVASCULAR STRESS TEST  04/23/2011   negative  . COLONOSCOPY    . FACIAL RECONSTRUCTION SURGERY  1996  . [place]  . POLYPECTOMY    . TONSILLECTOMY  1951  . US ECHOCARDIOGRAPHY  07/05/2012   mild-mod LA dilation,mild mitral insuff,mild AI    Current Medications: Outpatient Medications Prior to Visit  Medication Sig Dispense Refill  . alendronate (FOSAMAX) 70  MG tablet Take 70 mg by mouth once a week. Take with a full glass of water on an empty stomach.    . cabergoline (DOSTINEX) 0.5 MG tablet Take 0.25 mg by mouth 2 (two) times a week.    Marland Kitchen CALCIUM & MAGNESIUM CARBONATES PO Take 1,000 mg by mouth daily.    . Cholecalciferol (VITAMIN D3) 1000 units CAPS Take by mouth.    . hydrocortisone butyrate (LUCOID) 0.1 % CREA cream Apply topically 2 (two) times daily as needed.     . Multiple Vitamins-Minerals (MULTIVITAMIN WITH MINERALS) tablet Take 1 tablet by mouth daily.    . rosuvastatin (CRESTOR) 20 MG tablet Take 20 mg by mouth daily.  3  . tazarotene (TAZORAC) 0.05 % cream Apply topically as needed.    . zoster vaccine live, PF, (ZOSTAVAX) 02725 UNT/0.65ML injection Inject 19,400 Units into the skin once. Administer at pharmacy 1 each 0  . aspirin 81 MG tablet Take 81 mg by mouth daily.     No facility-administered medications prior to visit.      Allergies:   Sulfamethoxazole and Sulfonamide derivatives   Social History   Socioeconomic History  . Marital status: Married    Spouse name: Not on file  . Number of children: 2  . Years of education: Not on file  . Highest education level: Not on file  Occupational History  . Occupation: retired  Scientific laboratory technician  . Financial resource strain: Not on file  .  Food insecurity    Worry: Not on file    Inability: Not on file  . Transportation needs    Medical: Not on file    Non-medical: Not on file  Tobacco Use  . Smoking status: Never Smoker  . Smokeless tobacco: Never Used  Substance and Sexual Activity  . Alcohol use: Yes    Alcohol/week: 0.0 standard drinks    Comment: 4 glasses of wine a week  . Drug use: No  . Sexual activity: Not on file  Lifestyle  . Physical activity    Days per week: Not on file    Minutes per session: Not on file  . Stress: Not on file  Relationships  . Social Herbalist on phone: Not on file    Gets together: Not on file    Attends religious  service: Not on file    Active member of club or organization: Not on file    Attends meetings of clubs or organizations: Not on file    Relationship status: Not on file  Other Topics Concern  . Not on file  Social History Narrative  . Not on file     Family History:  The patient's family history includes Heart failure in his mother; Hypertension in his mother; Leukemia in his maternal grandfather; Stroke in his father, maternal grandmother, and mother.   ROS:   Please see the history of present illness.    ROS All other systems are reviewed and are negative.   PHYSICAL EXAM:   VS:  BP 108/70 (BP Location: Left Arm, Patient Position: Sitting, Cuff Size: Normal)   Pulse 70   Temp (!) 97.3 F (36.3 C)   Ht 6' (1.829 m)   Wt 164 lb (74.4 kg)   BMI 22.24 kg/m      General: Alert, oriented x3, no distress, lean and fit Head: no evidence of trauma, PERRL, EOMI, no exophtalmos or lid lag, no myxedema, no xanthelasma; normal ears, nose and oropharynx Neck: normal jugular venous pulsations and no hepatojugular reflux; brisk carotid pulses without delay and no carotid bruits Chest: clear to auscultation, no signs of consolidation by percussion or palpation, normal fremitus, symmetrical and full respiratory excursions Cardiovascular: normal position and quality of the apical impulse, regular rhythm, normal first and second heart sounds, no murmurs, rubs or gallops Abdomen: no tenderness or distention, no masses by palpation, no abnormal pulsatility or arterial bruits, normal bowel sounds, no hepatosplenomegaly Extremities: no clubbing, cyanosis or edema; 2+ radial, ulnar and brachial pulses bilaterally; 2+ right femoral, posterior tibial and dorsalis pedis pulses; 2+ left femoral, posterior tibial and dorsalis pedis pulses; no subclavian or femoral bruits Neurological: grossly nonfocal Psych: Normal mood and affect   Wt Readings from Last 3 Encounters:  07/19/19 164 lb (74.4 kg)   07/12/18 175 lb 9.6 oz (79.7 kg)  09/14/17 170 lb 6.4 oz (77.3 kg)      Studies/Labs Reviewed:   EKG:  EKG is ordered today.  Sinus rhythm with a single PVC, otherwise normal tracing  Recent Labs:/ Dr. Joylene Draft labs from Oct  Total cholesterol 157, HDL 49, LDL 83, triglycerides 74 Creatinine 0.9, hemoglobin 15.1, normal liver function tests  Lipid Panel    Component Value Date/Time   CHOL 231 (H) 07/31/2014 1735   TRIG 233 (H) 07/31/2014 1735   HDL 46 07/31/2014 1735   CHOLHDL 5.0 07/31/2014 1735   VLDL 47 (H) 07/31/2014 1735   LDLCALC 138 (H) 07/31/2014 1735  ASSESSMENT:    1. PVCs (premature ventricular contractions)   2. Hypercholesterolemia      PLAN:  In order of problems listed above:   1. PVCs: No recent symptoms of palpitations.  Treatment is not necessary.  No evidence of structural heart disease. 2. HLP: LDL cholesterol at goal on current dose of rosuvastatin, without side effects.  I agree with Dr. Silvestre Mesi recommendation to stop the aspirin for primary prevention.    Medication Adjustments/Labs and Tests Ordered: Current medicines are reviewed at length with the patient today.  Concerns regarding medicines are outlined above.  Medication changes, Labs and Tests ordered today are listed in the Patient Instructions below. Patient Instructions  Medication Instructions:  STOP the Aspirin  *If you need a refill on your cardiac medications before your next appointment, please call your pharmacy*  Lab Work: None ordered If you have labs (blood work) drawn today and your tests are completely normal, you will receive your results only by: Marland Kitchen MyChart Message (if you have MyChart) OR . A paper copy in the mail If you have any lab test that is abnormal or we need to change your treatment, we will call you to review the results.  Testing/Procedures: None ordered  Follow-Up: At [facility], you and your health needs are our priority.  As part of our  continuing mission to provide you with exceptional heart care, we have created designated Provider Care Teams.  These Care Teams include your primary Cardiologist (physician) and Advanced Practice Providers (APPs -  Physician Assistants and Nurse Practitioners) who all work together to provide you with the care you need, when you need it.  Your next appointment:   12 months  The format for your next appointment:   In Person  Provider:   Sanda Klein, MD      Signed, Sanda Klein, MD  07/19/2019 9:13 AM    [address], [place], [place]  [address] Phone: [phone]; Fax: [phone]

## 2019-07-19 NOTE — Patient Instructions (Signed)
Medication Instructions:  STOP the Aspirin  *If you need a refill on your cardiac medications before your next appointment, please call your pharmacy*  Lab Work: None ordered If you have labs (blood work) drawn today and your tests are completely normal, you will receive your results only by: Marland Kitchen MyChart Message (if you have MyChart) OR . A paper copy in the mail If you have any lab test that is abnormal or we need to change your treatment, we will call you to review the results.  Testing/Procedures: None ordered  Follow-Up: At [facility], you and your health needs are our priority.  As part of our continuing mission to provide you with exceptional heart care, we have created designated Provider Care Teams.  These Care Teams include your primary Cardiologist (physician) and Advanced Practice Providers (APPs -  Physician Assistants and Nurse Practitioners) who all work together to provide you with the care you need, when you need it.  Your next appointment:   12 months  The format for your next appointment:   In Person  Provider:   Sanda Klein, MD

## 2019-10-01 ENCOUNTER — Ambulatory Visit: Payer: Medicare Other

## 2019-10-12 ENCOUNTER — Ambulatory Visit: Payer: Medicare Other

## 2020-08-09 ENCOUNTER — Ambulatory Visit: Payer: Medicare PPO | Admitting: Cardiovascular Disease

## 2020-08-09 ENCOUNTER — Encounter: Payer: Self-pay | Admitting: Cardiovascular Disease

## 2020-08-09 ENCOUNTER — Other Ambulatory Visit: Payer: Self-pay

## 2020-08-09 VITALS — BP 128/82 | HR 67 | Ht 71.0 in | Wt 169.2 lb

## 2020-08-09 DIAGNOSIS — I493 Ventricular premature depolarization: Secondary | ICD-10-CM

## 2020-08-09 DIAGNOSIS — E7800 Pure hypercholesterolemia, unspecified: Secondary | ICD-10-CM

## 2020-08-09 NOTE — Progress Notes (Signed)
Cardiology Office Note    Date:  08/13/2020   ID:  Paul King, DOB 1946/07/04, MRN [id number]  PCP:  Crist Infante, MD  Cardiologist:   Sanda Klein, MD   Chief Complaint  Patient presents with  . Palpitations    History of Present Illness:  Paul King is a 74 y.o. male with history of PVCs, mixed hyperlipidemia, hyperprolactinemia, questionable diagnosis of mitral valve prolapse returning for follow-up.   He is to have problems with chest pressure occurring at rest but these have completely resolved after retirement.  They were never associated with exertion.  He is able to walk uphill carrying his golf clubs without complaints of dyspnea or chest discomfort.  Sometimes he walks as much as 6 miles in a day.  He denies shortness of breath at rest or with activity.  He has occasional palpitations that he notices at night.  In the past these have been associated with PVCs.  He underwent lung evaluation for bronchiectasis and there was evidence of atherosclerotic plaque in his vascular system.  This is not a new finding.  He is concerned about the possibility of having an abdominal aortic aneurysm, since there is a family history of this.  The patient specifically denies any chest pain at rest exertion, dyspnea at rest or with exertion, orthopnea, paroxysmal nocturnal dyspnea, syncope, focal neurological deficits, intermittent claudication, lower extremity edema, unexplained weight gain, cough, hemoptysis or wheezing.  He is on rosuvastatin with an LDL cholesterol of 83.  Past Medical History:  Diagnosis Date  . Adenomatous colon polyp    tubular  . Basal cell carcinoma   . Hyperlipidemia   . Hypoprolactinemia ([place])   . Melanoma ([place])   . Osteopenia 2000   determined via bone scan  . Osteoporosis   . Palpitations     Past Surgical History:  Procedure Laterality Date  . CARDIOVASCULAR STRESS TEST  04/23/2011   negative  . COLONOSCOPY    . FACIAL RECONSTRUCTION SURGERY   1996  . [place]  . POLYPECTOMY    . TONSILLECTOMY  1951  . US ECHOCARDIOGRAPHY  07/05/2012   mild-mod LA dilation,mild mitral insuff,mild AI    Current Medications: Outpatient Medications Prior to Visit  Medication Sig Dispense Refill  . alendronate (FOSAMAX) 70 MG tablet Take 70 mg by mouth once a week. Take with a full glass of water on an empty stomach.    . cabergoline (DOSTINEX) 0.5 MG tablet Take 0.25 mg by mouth 2 (two) times a week.    Marland Kitchen CALCIUM & MAGNESIUM CARBONATES PO Take 1,000 mg by mouth daily.    . Cholecalciferol (VITAMIN D3) 1000 units CAPS Take by mouth.    . hydrocortisone butyrate (LUCOID) 0.1 % CREA cream Apply topically 2 (two) times daily as needed.     . Multiple Vitamins-Minerals (MULTIVITAMIN WITH MINERALS) tablet Take 1 tablet by mouth daily.    . rosuvastatin (CRESTOR) 20 MG tablet Take 20 mg by mouth daily.  3  . tazarotene (TAZORAC) 0.05 % cream Apply topically as needed.    . zoster vaccine live, PF, (ZOSTAVAX) 76195 UNT/0.65ML injection Inject 19,400 Units into the skin once. Administer at pharmacy 1 each 0   No facility-administered medications prior to visit.     Allergies:   Sulfamethoxazole and Sulfonamide derivatives   Social History   Socioeconomic History  . Marital status: Married    Spouse name: Not on file  . Number of children: 2  .  Years of education: Not on file  . Highest education level: Not on file  Occupational History  . Occupation: retired  Tobacco Use  . Smoking status: Never Smoker  . Smokeless tobacco: Never Used  Substance and Sexual Activity  . Alcohol use: Yes    Alcohol/week: 0.0 standard drinks    Comment: 4 glasses of wine a week  . Drug use: No  . Sexual activity: Not on file  Other Topics Concern  . Not on file  Social History Narrative  . Not on file   Social Determinants of Health   Financial Resource Strain: Not on file  Food Insecurity: Not on file  Transportation Needs: Not on file   Physical Activity: Not on file  Stress: Not on file  Social Connections: Not on file     Family History:  The patient's family history includes Heart failure in his mother; Hypertension in his mother; Leukemia in his maternal grandfather; Stroke in his father, maternal grandmother, and mother.   ROS:   Please see the history of present illness.    ROS  All other systems are reviewed and are negative.  PHYSICAL EXAM:   VS:  BP 128/82   Pulse 67   Ht 5\' 11"  (1.803 m)   Wt 169 lb 3.2 oz (76.7 kg)   SpO2 96%   BMI 23.60 kg/m      General: Alert, oriented x3, no distress, appears lean and fit Head: no evidence of trauma, PERRL, EOMI, no exophtalmos or lid lag, no myxedema, no xanthelasma; normal ears, nose and oropharynx Neck: normal jugular venous pulsations and no hepatojugular reflux; brisk carotid pulses without delay and no carotid bruits Chest: clear to auscultation, no signs of consolidation by percussion or palpation, normal fremitus, symmetrical and full respiratory excursions Cardiovascular: normal position and quality of the apical impulse, regular rhythm, normal first and second heart sounds, no murmurs, rubs or gallops Abdomen: no tenderness or distention, no masses by palpation, no abnormal pulsatility or arterial bruits, normal bowel sounds, no hepatosplenomegaly Extremities: no clubbing, cyanosis or edema; 2+ radial, ulnar and brachial pulses bilaterally; 2+ right femoral, posterior tibial and dorsalis pedis pulses; 2+ left femoral, posterior tibial and dorsalis pedis pulses; no subclavian or femoral bruits Neurological: grossly nonfocal Psych: Normal mood and affect    Wt Readings from Last 3 Encounters:  08/09/20 169 lb 3.2 oz (76.7 kg)  07/19/19 164 lb (74.4 kg)  07/12/18 175 lb 9.6 oz (79.7 kg)      Studies/Labs Reviewed:   EKG:  EKG is ordered today.  It shows normal sinus rhythm, questionable left atrial abnormality, minor nonspecific T wave changes, QTC  441 ms. Recent Labs:/ Dr. Joylene Draft labs from Oct 2020 Total cholesterol 157, HDL 49, LDL 83, triglycerides 74 Creatinine 0.9, hemoglobin 15.1, normal liver function tests  Have requested the more recent labs, not yet available for review.  Lipid Panel    Component Value Date/Time   CHOL 231 (H) 07/31/2014 1735   TRIG 233 (H) 07/31/2014 1735   HDL 46 07/31/2014 1735   CHOLHDL 5.0 07/31/2014 1735   VLDL 47 (H) 07/31/2014 1735   LDLCALC 138 (H) 07/31/2014 1735    ASSESSMENT:    1. PVCs (premature ventricular contractions)   2. Hypercholesterolemia      PLAN:  In order of problems listed above:   1. PVCs: Minimally symptomatic.  No evidence of structural heart disease on previous echo and stress test.  No specific treatment necessary. 2. HLP: He  is on a fairly high dose of an effective statin and has had substantial reduction in his LDL cholesterol.  Ideally would get the LDL less than 70, but LDL less than 100 is acceptable in the absence of well-established vascular disease.  All his other cardiac risk factors are very well addressed.  At his request, I suggested performing a vascular screen (summary ultrasound testing for carotid disease, AAA, ABIs), in particular to address his concern about possible aortic aneurysm.  No evidence of thoracic aortic aneurysm on previous imaging studies of the chest.    Medication Adjustments/Labs and Tests Ordered: Current medicines are reviewed at length with the patient today.  Concerns regarding medicines are outlined above.  Medication changes, Labs and Tests ordered today are listed in the Patient Instructions below. Patient Instructions  Medication Instructions:  No changes *If you need a refill on your cardiac medications before your next appointment, please call your pharmacy*   Lab Work: None ordered If you have labs (blood work) drawn today and your tests are completely normal, you will receive your results only by: Marland Kitchen MyChart  Message (if you have MyChart) OR . A paper copy in the mail If you have any lab test that is abnormal or we need to change your treatment, we will call you to review the results.   Testing/Procedures: Call [phone] to make an appointment for a VASCUscreen. This will be three exams for $125.   Follow-Up: At [facility], you and your health needs are our priority.  As part of our continuing mission to provide you with exceptional heart care, we have created designated Provider Care Teams.  These Care Teams include your primary Cardiologist (physician) and Advanced Practice Providers (APPs -  Physician Assistants and Nurse Practitioners) who all work together to provide you with the care you need, when you need it.  We recommend signing up for the patient portal called "MyChart".  Sign up information is provided on this After Visit Summary.  MyChart is used to connect with patients for Virtual Visits (Telemedicine).  Patients are able to view lab/test results, encounter notes, upcoming appointments, etc.  Non-urgent messages can be sent to your provider as well.   To learn more about what you can do with MyChart, go to [email or web address].    Your next appointment:   12 month(s)  The format for your next appointment:   In Person  Provider:   You may see Sanda Klein, MD or one of the following Advanced Practice Providers on your designated Care Team:    Almyra Deforest, PA-C  Fabian Sharp, [place] or   Roby Lofts, PA-C        Signed, Sanda Klein, MD  08/13/2020 10:44 AM    [facility] [address], [place], [place]  [address] Phone: [phone]; Fax: [phone]

## 2020-08-09 NOTE — Patient Instructions (Signed)
Medication Instructions:  No changes *If you need a refill on your cardiac medications before your next appointment, please call your pharmacy*   Lab Work: None ordered If you have labs (blood work) drawn today and your tests are completely normal, you will receive your results only by: Marland Kitchen MyChart Message (if you have MyChart) OR . A paper copy in the mail If you have any lab test that is abnormal or we need to change your treatment, we will call you to review the results.   Testing/Procedures: Call [phone] to make an appointment for a VASCUscreen. This will be three exams for $125.   Follow-Up: At [facility], you and your health needs are our priority.  As part of our continuing mission to provide you with exceptional heart care, we have created designated Provider Care Teams.  These Care Teams include your primary Cardiologist (physician) and Advanced Practice Providers (APPs -  Physician Assistants and Nurse Practitioners) who all work together to provide you with the care you need, when you need it.  We recommend signing up for the patient portal called "MyChart".  Sign up information is provided on this After Visit Summary.  MyChart is used to connect with patients for Virtual Visits (Telemedicine).  Patients are able to view lab/test results, encounter notes, upcoming appointments, etc.  Non-urgent messages can be sent to your provider as well.   To learn more about what you can do with MyChart, go to [email or web address].    Your next appointment:   12 month(s)  The format for your next appointment:   In Person  Provider:   You may see Sanda Klein, MD or one of the following Advanced Practice Providers on your designated Care Team:    Almyra Deforest, PA-C  Fabian Sharp, PA-C or   Roby Lofts, [place]

## 2020-08-13 ENCOUNTER — Encounter: Payer: Self-pay | Admitting: Cardiovascular Disease

## 2020-08-22 ENCOUNTER — Other Ambulatory Visit: Payer: Self-pay | Admitting: Internal Medicine

## 2020-08-22 DIAGNOSIS — M8580 Other specified disorders of bone density and structure, unspecified site: Secondary | ICD-10-CM

## 2020-09-25 ENCOUNTER — Telehealth: Payer: Self-pay | Admitting: Pulmonary Disease

## 2020-09-25 NOTE — Telephone Encounter (Signed)
He should see his PCP unless OV with other MD available

## 2020-09-25 NOTE — Telephone Encounter (Signed)
Called and spoke to pt. Informed him of the recs per RA. Pt verbalized understanding and states he will contact his PCP first and if he is unable to help then pt will call back to schedule a sooner consult appt with another MD. Nothing further needed at this time.

## 2020-09-25 NOTE — Telephone Encounter (Signed)
Called spoke to patient.  Patient hasn't been seen in over 3 years.  Started having lots of mucus and a cough.  Tested negative for covid on 09/24/20  Patient was scheduled for new patient consult on 10/29/20 was wondering if there was anything to do before then or if he could get in sooner. This was first available. Told patient he might want to reach out to his PCP, he said he would do that.   Will route to Dr. Elsworth Soho for further recommendations if any since patient hasn't been seen in over 3 years

## 2020-09-26 ENCOUNTER — Institutional Professional Consult (permissible substitution): Payer: Medicare PPO | Admitting: Internal Medicine

## 2020-09-27 NOTE — Telephone Encounter (Signed)
Nothing note in message. Will close encounter.

## 2020-10-17 ENCOUNTER — Other Ambulatory Visit: Payer: Medicare PPO

## 2020-10-29 ENCOUNTER — Encounter: Payer: Self-pay | Admitting: Pulmonary Disease

## 2020-10-29 ENCOUNTER — Ambulatory Visit: Payer: Medicare PPO | Admitting: Pulmonary Disease

## 2020-10-29 ENCOUNTER — Other Ambulatory Visit: Payer: Self-pay

## 2020-10-29 VITALS — BP 110/72 | HR 73 | Temp 97.1°F | Ht 71.0 in | Wt 166.2 lb

## 2020-10-29 DIAGNOSIS — J470 Bronchiectasis with acute lower respiratory infection: Secondary | ICD-10-CM | POA: Diagnosis not present

## 2020-10-29 DIAGNOSIS — J479 Bronchiectasis, uncomplicated: Secondary | ICD-10-CM | POA: Diagnosis not present

## 2020-10-29 NOTE — Progress Notes (Signed)
Subjective:    Patient ID: Paul King, male    DOB: 02-Mar-1946, 75 y.o.   MRN: [id number]  HPI  42 never smoker presents to reestablish care for bronchiectasis.  Chief Complaint  Patient presents with  . Establish Care    Restablish care for bronchiectasis. States he recently had a flare up about 3-4 weeks ago. Received treatment from his PCP has been feeling ok since then. Denies any increased SOB. Still has a productive cough with clear phlegm.    Last office visit was 09/2017.  He was diagnosed with bilateral lower lobe bronchiectasis, he had chest cold and was difficult to clear in 2018 and then again 09/2017.  He has done well for the last 3 years. About January 2022, he had another episode of head cold followed by bronchitis.  He could not be worked into our office and contacted his PCP was given a course of cefdinir.  For about 2 weeks he had cough productive of light yellow sputum with copious amounts without taking any expectorant.  This is finally resolved.  Breathing is back to baseline.  He is able to play golf on a daily basis He is planning to leave the golf trip to [place] later this year     Significant tests/ events reviewed  CT chest  05/2017 2.7 cm calcified lymph node and showed bronchiectasis most prominent in the left lower lobe with mild scarring at the left base. There was left lower lobe tree-in-bud densities consistent with bronchiolitis. ? H/o histo    He reports that a density was noted on his chest x-ray back when he was in college. He was disqualified from the enrolling for the [place] [employer] due to this. Many years ago he had a skiing injury and the radiologist on reviewing his x-rays said that it may be due to histoplasmosis. He grew up in [place] and has lived in [place] most of his adult life. 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 He will drink about 2 glasses of wine per  week   Past Medical History:  Diagnosis Date  . Adenomatous colon polyp    tubular  . Basal cell carcinoma   . Hyperlipidemia   . Hypoprolactinemia ([place])   . Melanoma ([place])   . Osteopenia 2000   determined via bone scan  . Osteoporosis   . Palpitations     Past Surgical History:  Procedure Laterality Date  . CARDIOVASCULAR STRESS TEST  04/23/2011   negative  . COLONOSCOPY    . FACIAL RECONSTRUCTION SURGERY  1996  . [facility]  . POLYPECTOMY    . TONSILLECTOMY  1951  . US ECHOCARDIOGRAPHY  07/05/2012   mild-mod LA dilation,mild mitral insuff,mild AI    Allergies  Allergen Reactions  . Sulfamethoxazole Shortness Of Breath  . Sulfonamide Derivatives     Social History   Socioeconomic History  . Marital status: Married    Spouse name: Not on file  . Number of children: 2  . Years of education: Not on file  . Highest education level: Not on file  Occupational History  . Occupation: retired  Tobacco Use  . Smoking status: Never Smoker  . Smokeless tobacco: Never Used  Substance and Sexual Activity  . Alcohol use: Yes    Alcohol/week: 0.0 standard drinks    Comment: 4 glasses of wine a week  . Drug use: No  .  Sexual activity: Not on file  Other Topics Concern  . Not on file  Social History Narrative  . Not on file   Social Determinants of Health   Financial Resource Strain: Not on file  Food Insecurity: Not on file  Transportation Needs: Not on file  Physical Activity: Not on file  Stress: Not on file  Social Connections: Not on file  Intimate Partner Violence: Not on file    Family History  Problem Relation Age of Onset  . Heart failure Mother   . Stroke Mother   . Hypertension Mother   . Stroke Father   . Stroke Maternal Grandmother   . Leukemia Maternal Grandfather   . Colon cancer Neg Hx   . Colon polyps Neg Hx   . Stomach cancer Neg Hx   . Rectal cancer Neg Hx   . Esophageal cancer Neg Hx      Review of Systems neg for any  significant sore throat, dysphagia, itching, sneezing, nasal congestion or excess/ purulent secretions, fever, chills, sweats, unintended wt loss, pleuritic or exertional cp, hempoptysis, orthopnea pnd or change in chronic leg swelling. Also denies presyncope, palpitations, heartburn, abdominal pain, nausea, vomiting, diarrhea or change in bowel or urinary habits, dysuria,hematuria, rash, arthralgias, visual complaints, headache, numbness weakness or ataxia.     Objective:   Physical Exam   Gen. Pleasant, well-nourished, in no distress, normal affect ENT - no pallor,icterus, no post nasal drip Neck: No JVD, no thyromegaly, no carotid bruits Lungs: no use of accessory muscles, no dullness to percussion, RT base fine rales no rhonchi  Cardiovascular: Rhythm regular, heart sounds  normal, no murmurs or gallops, no peripheral edema Abdomen: soft and non-tender, no hepatosplenomegaly, BS normal. Musculoskeletal: No deformities, no cyanosis or clubbing Neuro:  alert, non focal        Assessment & Plan:

## 2020-10-29 NOTE — Patient Instructions (Signed)
  HRCT chest to follow up on  Bronchiectasis   We discussed signs & symptoms of bronchitis  For flare , mucinex 600 mg twice daily Flutter valve

## 2020-10-29 NOTE — Assessment & Plan Note (Signed)
His recent exacerbation has resolved.  Again we emphasized airway clearance is the main focus for bronchiectatic exacerbation.  We discussed signs and symptoms of acute bronchitis, he will need an antibiotic whenever he has increased quantity of sputum or change in color sputum or of course systemic signs of infection such as fever. We discussed airway clearance measures with Mucinex and flutter valve.  He does not seem to need hypertonic saline at present. We will proceed with high-resolution CT scan of his chest to look for progression of his bronchiectasis. Regarding etiology he has calcification of his lymph node and possible prior history of histoplasmosis so I think old granulomatous disease is still likely cause , however if there is progression of bronchiectasis on CT then we may have to consider other causes such as atypical mycobacteria

## 2020-11-07 ENCOUNTER — Other Ambulatory Visit: Payer: Medicare PPO

## 2020-11-19 ENCOUNTER — Ambulatory Visit (INDEPENDENT_AMBULATORY_CARE_PROVIDER_SITE_OTHER)
Admission: RE | Admit: 2020-11-19 | Discharge: 2020-11-19 | Disposition: A | Discharge status: Home / Self Care | Payer: Medicare PPO | Source: Ambulatory Visit | Attending: Pulmonary Disease | Admitting: Pulmonary Disease

## 2020-11-19 ENCOUNTER — Other Ambulatory Visit: Payer: Self-pay

## 2020-11-19 DIAGNOSIS — J479 Bronchiectasis, uncomplicated: Secondary | ICD-10-CM

## 2020-11-19 DIAGNOSIS — I2510 Atherosclerotic heart disease of native coronary artery without angina pectoris: Secondary | ICD-10-CM | POA: Diagnosis not present

## 2020-11-19 DIAGNOSIS — R590 Localized enlarged lymph nodes: Secondary | ICD-10-CM | POA: Diagnosis not present

## 2020-11-19 DIAGNOSIS — I700 Atherosclerosis of aorta: Secondary | ICD-10-CM | POA: Diagnosis not present

## 2020-11-22 NOTE — Progress Notes (Signed)
Called and left detailed message on voicemail per DPR of CT result per Dr Elsworth Soho. Advised to please feel free to call back if any questions. Contact number provided. Nothing further needed at this time.

## 2020-12-20 ENCOUNTER — Other Ambulatory Visit: Payer: Medicare PPO

## 2021-01-10 DIAGNOSIS — Z1159 Encounter for screening for other viral diseases: Secondary | ICD-10-CM | POA: Diagnosis not present

## 2021-01-16 DIAGNOSIS — Z1159 Encounter for screening for other viral diseases: Secondary | ICD-10-CM | POA: Diagnosis not present

## 2021-02-20 DIAGNOSIS — Z85828 Personal history of other malignant neoplasm of skin: Secondary | ICD-10-CM | POA: Diagnosis not present

## 2021-02-20 DIAGNOSIS — L821 Other seborrheic keratosis: Secondary | ICD-10-CM | POA: Diagnosis not present

## 2021-02-20 DIAGNOSIS — L570 Actinic keratosis: Secondary | ICD-10-CM | POA: Diagnosis not present

## 2021-02-20 DIAGNOSIS — D1801 Hemangioma of skin and subcutaneous tissue: Secondary | ICD-10-CM | POA: Diagnosis not present

## 2021-02-20 DIAGNOSIS — L812 Freckles: Secondary | ICD-10-CM | POA: Diagnosis not present

## 2021-05-31 ENCOUNTER — Other Ambulatory Visit: Payer: Self-pay

## 2021-05-31 ENCOUNTER — Ambulatory Visit
Admission: RE | Admit: 2021-05-31 | Discharge: 2021-05-31 | Disposition: A | Discharge status: Home / Self Care | Payer: Medicare PPO | Source: Ambulatory Visit | Attending: Internal Medicine | Admitting: Internal Medicine

## 2021-05-31 DIAGNOSIS — M8580 Other specified disorders of bone density and structure, unspecified site: Secondary | ICD-10-CM

## 2021-05-31 DIAGNOSIS — M8589 Other specified disorders of bone density and structure, multiple sites: Secondary | ICD-10-CM | POA: Diagnosis not present

## 2021-07-16 DIAGNOSIS — M810 Age-related osteoporosis without current pathological fracture: Secondary | ICD-10-CM | POA: Diagnosis not present

## 2021-07-16 DIAGNOSIS — I2510 Atherosclerotic heart disease of native coronary artery without angina pectoris: Secondary | ICD-10-CM | POA: Diagnosis not present

## 2021-07-16 DIAGNOSIS — E785 Hyperlipidemia, unspecified: Secondary | ICD-10-CM | POA: Diagnosis not present

## 2021-07-16 DIAGNOSIS — Z125 Encounter for screening for malignant neoplasm of prostate: Secondary | ICD-10-CM | POA: Diagnosis not present

## 2021-07-23 DIAGNOSIS — R82998 Other abnormal findings in urine: Secondary | ICD-10-CM | POA: Diagnosis not present

## 2021-07-23 DIAGNOSIS — E785 Hyperlipidemia, unspecified: Secondary | ICD-10-CM | POA: Diagnosis not present

## 2021-07-23 DIAGNOSIS — E221 Hyperprolactinemia: Secondary | ICD-10-CM | POA: Diagnosis not present

## 2021-07-23 DIAGNOSIS — Z23 Encounter for immunization: Secondary | ICD-10-CM | POA: Diagnosis not present

## 2021-07-23 DIAGNOSIS — M810 Age-related osteoporosis without current pathological fracture: Secondary | ICD-10-CM | POA: Diagnosis not present

## 2021-07-23 DIAGNOSIS — N529 Male erectile dysfunction, unspecified: Secondary | ICD-10-CM | POA: Diagnosis not present

## 2021-07-23 DIAGNOSIS — Z0000 Encounter for general adult medical examination without abnormal findings: Secondary | ICD-10-CM | POA: Diagnosis not present

## 2021-07-23 DIAGNOSIS — M722 Plantar fascial fibromatosis: Secondary | ICD-10-CM | POA: Diagnosis not present

## 2021-07-23 DIAGNOSIS — I2510 Atherosclerotic heart disease of native coronary artery without angina pectoris: Secondary | ICD-10-CM | POA: Diagnosis not present

## 2021-07-24 ENCOUNTER — Other Ambulatory Visit: Payer: Self-pay | Admitting: Internal Medicine

## 2021-07-24 DIAGNOSIS — E785 Hyperlipidemia, unspecified: Secondary | ICD-10-CM

## 2021-08-08 DIAGNOSIS — H04123 Dry eye syndrome of bilateral lacrimal glands: Secondary | ICD-10-CM | POA: Diagnosis not present

## 2021-08-08 DIAGNOSIS — H524 Presbyopia: Secondary | ICD-10-CM | POA: Diagnosis not present

## 2021-08-08 DIAGNOSIS — H2513 Age-related nuclear cataract, bilateral: Secondary | ICD-10-CM | POA: Diagnosis not present

## 2021-08-14 ENCOUNTER — Other Ambulatory Visit: Payer: Self-pay

## 2021-08-14 ENCOUNTER — Encounter: Payer: Self-pay | Admitting: Cardiovascular Disease

## 2021-08-14 ENCOUNTER — Ambulatory Visit: Payer: Medicare PPO | Admitting: Cardiovascular Disease

## 2021-08-14 VITALS — BP 114/74 | HR 64 | Ht 72.0 in | Wt 171.8 lb

## 2021-08-14 DIAGNOSIS — I2510 Atherosclerotic heart disease of native coronary artery without angina pectoris: Secondary | ICD-10-CM | POA: Diagnosis not present

## 2021-08-14 DIAGNOSIS — I493 Ventricular premature depolarization: Secondary | ICD-10-CM | POA: Diagnosis not present

## 2021-08-14 DIAGNOSIS — I700 Atherosclerosis of aorta: Secondary | ICD-10-CM

## 2021-08-14 DIAGNOSIS — E7800 Pure hypercholesterolemia, unspecified: Secondary | ICD-10-CM | POA: Diagnosis not present

## 2021-08-14 NOTE — Patient Instructions (Signed)

## 2021-08-14 NOTE — Progress Notes (Signed)
Cardiology Office Note    Date:  08/15/2021   ID:  Paul King, DOB [birth year], MRN [id number]  PCP:  Crist Infante, MD  Cardiologist:   Sanda Klein, MD   No chief complaint on file.   History of Present Illness:  Paul King is a 75 y.o. male with history of PVCs, mixed hyperlipidemia, hyperprolactinemia, questionable diagnosis of mitral valve prolapse returning for follow-up.   He is doing well.  Seems to be physically active.  He plays golf several times a week, often without riding the cart, organizes golf and trips to [place] in [place] walking 5-6 miles a day, without limitations.  Denies shortness of breath or chest pain at rest or with activity.  As before, he has occasional palpitations that are more noticeable at night, but never associated dyspnea, angina or syncope.  In the past these have been associated with PVCs on monitoring devices.  Multiple imaging studies over the years (performed for evaluation of lung disease) have shown evidence of coronary and aortic atherosclerosis.  He is scheduled to have a formal coronary calcium scoring test on December 27.  He has been on treatment with rosuvastatin with good lipid levels for several years now.  Normal treadmill stress test in 2012  Past Medical History:  Diagnosis Date   Adenomatous colon polyp    tubular   Basal cell carcinoma    Hyperlipidemia    Hypoprolactinemia ([facility])    Melanoma ([facility])    Osteopenia 2000   determined via bone scan   Osteoporosis    Palpitations     Past Surgical History:  Procedure Laterality Date   CARDIOVASCULAR STRESS TEST  04/23/2011   negative   COLONOSCOPY     FACIAL RECONSTRUCTION SURGERY  1996   MOHS SURGERY  1996   POLYPECTOMY     TONSILLECTOMY  1951   US ECHOCARDIOGRAPHY  07/05/2012   mild-mod LA dilation,mild mitral insuff,mild AI    Current Medications: Outpatient Medications Prior to Visit  Medication Sig Dispense Refill   alendronate (FOSAMAX) 70 MG tablet Take  70 mg by mouth once a week. Take with a full glass of water on an empty stomach.     cabergoline (DOSTINEX) 0.5 MG tablet Take 0.25 mg by mouth 2 (two) times a week.     CALCIUM & MAGNESIUM CARBONATES PO Take 1,000 mg by mouth daily.     Cholecalciferol (VITAMIN D3) 1000 units CAPS Take by mouth daily in the afternoon.     ezetimibe (ZETIA) 10 MG tablet Take 10 mg by mouth daily.     Multiple Vitamins-Minerals (MULTIVITAMIN WITH MINERALS) tablet Take 1 tablet by mouth daily.     rosuvastatin (CRESTOR) 20 MG tablet Take 20 mg by mouth daily.  3   hydrocortisone butyrate (LUCOID) 0.1 % CREA cream Apply topically 2 (two) times daily as needed.  (Patient not taking: Reported on 08/14/2021)     sildenafil (REVATIO) 20 MG tablet as needed. (Patient not taking: Reported on 08/14/2021)     tazarotene (TAZORAC) 0.05 % cream Apply topically as needed. (Patient not taking: Reported on 08/14/2021)     No facility-administered medications prior to visit.     Allergies:   Sulfamethoxazole and Sulfonamide derivatives   Social History   Socioeconomic History   Marital status: Married    Spouse name: Not on file   Number of children: 2   Years of education: Not on file   Highest education level: Not on file  Occupational  History   Occupation: retired  Tobacco Use   Smoking status: Never   Smokeless tobacco: Never  Substance and Sexual Activity   Alcohol use: Yes    Alcohol/week: 0.0 standard drinks    Comment: 4 glasses of wine a week   Drug use: No   Sexual activity: Not on file  Other Topics Concern   Not on file  Social History Narrative   Not on file   Social Determinants of Health   Financial Resource Strain: Not on file  Food Insecurity: Not on file  Transportation Needs: Not on file  Physical Activity: Not on file  Stress: Not on file  Social Connections: Not on file     Family History:  The patient's family history includes Heart failure in his mother; Hypertension in his  mother; Leukemia in his maternal grandfather; Stroke in his father, maternal grandmother, and mother.   ROS:   Please see the history of present illness.    ROS  All other systems are reviewed and are negative.  PHYSICAL EXAM:   VS:  BP 114/74 (BP Location: Left Arm, Patient Position: Sitting, Cuff Size: Normal)    Pulse 64    Ht 6' (1.829 m)    Wt 171 lb 12.8 oz (77.9 kg)    SpO2 97%    BMI 23.30 kg/m      General: Alert, oriented x3, no distress, lean, appears fit and younger than stated age Head: no evidence of trauma, PERRL, EOMI, no exophtalmos or lid lag, no myxedema, no xanthelasma; normal ears, nose and oropharynx Neck: normal jugular venous pulsations and no hepatojugular reflux; brisk carotid pulses without delay and no carotid bruits Chest: clear to auscultation, no signs of consolidation by percussion or palpation, normal fremitus, symmetrical and full respiratory excursions Cardiovascular: normal position and quality of the apical impulse, regular rhythm, normal first and second heart sounds, no murmurs, rubs or gallops Abdomen: no tenderness or distention, no masses by palpation, no abnormal pulsatility or arterial bruits, normal bowel sounds, no hepatosplenomegaly Extremities: no clubbing, cyanosis or edema; 2+ radial, ulnar and brachial pulses bilaterally; 2+ right femoral, posterior tibial and dorsalis pedis pulses; 2+ left femoral, posterior tibial and dorsalis pedis pulses; no subclavian or femoral bruits Neurological: grossly nonfocal Psych: Normal mood and affect     Wt Readings from Last 3 Encounters:  08/14/21 171 lb 12.8 oz (77.9 kg)  10/29/20 166 lb 3.2 oz (75.4 kg)  08/09/20 169 lb 3.2 oz (76.7 kg)      Studies/Labs Reviewed:   EKG:  EKG is ordered today.  It shows normal sinus rhythm, tiny Q waves in leads III and F and is otherwise a normal tracing.  QTc 428 ms Recent Labs:/ Dr. Joylene Draft labs from Oct 2020 Total cholesterol 157, HDL 49, LDL 83,  triglycerides 74 Creatinine 0.9, hemoglobin 15.1, normal liver function tests  06/20/2020 Total cholesterol 144, HDL 44, LDL 82, triglycerides 88, creatinine 0.9, potassium 4.3   Lipid Panel    Component Value Date/Time   CHOL 231 (H) 07/31/2014 1735   TRIG 233 (H) 07/31/2014 1735   HDL 46 07/31/2014 1735   CHOLHDL 5.0 07/31/2014 1735   VLDL 47 (H) 07/31/2014 1735   LDLCALC 138 (H) 07/31/2014 1735    ASSESSMENT:    1. PVCs (premature ventricular contractions)   2. Hypercholesterolemia   3. Coronary artery calcification seen on CT scan   4. Aortic atherosclerosis ([place])      PLAN:  In order of problems  listed above:   PVCs: Minimally symptomatic, no structural heart disease has been identified.  They do not bother him much and they do not require specific therapy. HLP: LDL is less than 50% of baseline after treatment with rosuvastatin.  A coronary calcium score may better inform our target LDL.  If he does have a higher than average calcium score, we can provide more aggressive treatment but he has a very healthy lifestyle and despite being physically active is asymptomatic.    Medication Adjustments/Labs and Tests Ordered: Current medicines are reviewed at length with the patient today.  Concerns regarding medicines are outlined above.  Medication changes, Labs and Tests ordered today are listed in the Patient Instructions below. Patient Instructions  Medication Instructions:  No changes *If you need a refill on your cardiac medications before your next appointment, please call your pharmacy*   Lab Work: None ordered If you have labs (blood work) drawn today and your tests are completely normal, you will receive your results only by: [place] (if you have MyChart) OR A paper copy in the mail If you have any lab test that is abnormal or we need to change your treatment, we will call you to review the results.   Testing/Procedures: None  ordered   Follow-Up: At [facility], you and your health needs are our priority.  As part of our continuing mission to provide you with exceptional heart care, we have created designated Provider Care Teams.  These Care Teams include your primary Cardiologist (physician) and Advanced Practice Providers (APPs -  Physician Assistants and Nurse Practitioners) who all work together to provide you with the care you need, when you need it.  We recommend signing up for the patient portal called "MyChart".  Sign up information is provided on this After Visit Summary.  MyChart is used to connect with patients for Virtual Visits (Telemedicine).  Patients are able to view lab/test results, encounter notes, upcoming appointments, etc.  Non-urgent messages can be sent to your provider as well.   To learn more about what you can do with MyChart, go to [email or web address].    Your next appointment:   12 month(s)  The format for your next appointment:   In Person  Provider:   Sanda Klein, MD      Signed, Sanda Klein, MD  08/15/2021 3:26 PM    [address], [place], [place]  [address] Phone: [phone]; Fax: [phone]

## 2021-08-15 DIAGNOSIS — I700 Atherosclerosis of aorta: Secondary | ICD-10-CM | POA: Insufficient documentation

## 2021-08-16 DIAGNOSIS — E221 Hyperprolactinemia: Secondary | ICD-10-CM | POA: Diagnosis not present

## 2021-08-16 DIAGNOSIS — M8580 Other specified disorders of bone density and structure, unspecified site: Secondary | ICD-10-CM | POA: Diagnosis not present

## 2021-08-20 DIAGNOSIS — Z85828 Personal history of other malignant neoplasm of skin: Secondary | ICD-10-CM | POA: Diagnosis not present

## 2021-08-20 DIAGNOSIS — D225 Melanocytic nevi of trunk: Secondary | ICD-10-CM | POA: Diagnosis not present

## 2021-08-20 DIAGNOSIS — L570 Actinic keratosis: Secondary | ICD-10-CM | POA: Diagnosis not present

## 2021-08-20 DIAGNOSIS — L738 Other specified follicular disorders: Secondary | ICD-10-CM | POA: Diagnosis not present

## 2021-08-20 DIAGNOSIS — L812 Freckles: Secondary | ICD-10-CM | POA: Diagnosis not present

## 2021-08-20 DIAGNOSIS — D1801 Hemangioma of skin and subcutaneous tissue: Secondary | ICD-10-CM | POA: Diagnosis not present

## 2021-08-20 DIAGNOSIS — L821 Other seborrheic keratosis: Secondary | ICD-10-CM | POA: Diagnosis not present

## 2021-08-20 DIAGNOSIS — D2261 Melanocytic nevi of right upper limb, including shoulder: Secondary | ICD-10-CM | POA: Diagnosis not present

## 2021-08-27 ENCOUNTER — Other Ambulatory Visit: Payer: Medicare PPO

## 2021-08-27 ENCOUNTER — Ambulatory Visit
Admission: RE | Admit: 2021-08-27 | Discharge: 2021-08-27 | Disposition: A | Discharge status: Home / Self Care | Payer: No Typology Code available for payment source | Source: Ambulatory Visit | Attending: Internal Medicine | Admitting: Internal Medicine

## 2021-08-27 DIAGNOSIS — E785 Hyperlipidemia, unspecified: Secondary | ICD-10-CM

## 2021-08-27 DIAGNOSIS — I700 Atherosclerosis of aorta: Secondary | ICD-10-CM | POA: Diagnosis not present

## 2021-09-13 DIAGNOSIS — E785 Hyperlipidemia, unspecified: Secondary | ICD-10-CM | POA: Diagnosis not present

## 2021-11-04 DIAGNOSIS — I2510 Atherosclerotic heart disease of native coronary artery without angina pectoris: Secondary | ICD-10-CM | POA: Diagnosis not present

## 2021-11-04 DIAGNOSIS — E785 Hyperlipidemia, unspecified: Secondary | ICD-10-CM | POA: Diagnosis not present

## 2021-11-04 DIAGNOSIS — H8113 Benign paroxysmal vertigo, bilateral: Secondary | ICD-10-CM | POA: Diagnosis not present

## 2022-01-14 ENCOUNTER — Encounter: Payer: Self-pay | Admitting: Gastroenterology

## 2022-01-24 ENCOUNTER — Encounter: Payer: Self-pay | Admitting: Gastroenterology

## 2022-02-18 DIAGNOSIS — Z85828 Personal history of other malignant neoplasm of skin: Secondary | ICD-10-CM | POA: Diagnosis not present

## 2022-02-18 DIAGNOSIS — L821 Other seborrheic keratosis: Secondary | ICD-10-CM | POA: Diagnosis not present

## 2022-02-18 DIAGNOSIS — L570 Actinic keratosis: Secondary | ICD-10-CM | POA: Diagnosis not present

## 2022-02-18 DIAGNOSIS — D1801 Hemangioma of skin and subcutaneous tissue: Secondary | ICD-10-CM | POA: Diagnosis not present

## 2022-02-18 DIAGNOSIS — D2272 Melanocytic nevi of left lower limb, including hip: Secondary | ICD-10-CM | POA: Diagnosis not present

## 2022-02-18 DIAGNOSIS — L812 Freckles: Secondary | ICD-10-CM | POA: Diagnosis not present

## 2022-02-20 ENCOUNTER — Ambulatory Visit (AMBULATORY_SURGERY_CENTER): Payer: Self-pay

## 2022-02-20 ENCOUNTER — Other Ambulatory Visit: Payer: Self-pay

## 2022-02-20 VITALS — Ht 72.0 in | Wt 170.0 lb

## 2022-02-20 DIAGNOSIS — Z86010 Personal history of colonic polyps: Secondary | ICD-10-CM

## 2022-02-20 MED ORDER — NA SULFATE-K SULFATE-MG SULF 17.5-3.13-1.6 GM/177ML PO SOLN: 1.0000 | Freq: Once | ORAL | 0 refills | Status: AC

## 2022-03-11 ENCOUNTER — Encounter: Payer: Self-pay | Admitting: Gastroenterology

## 2022-03-11 ENCOUNTER — Encounter: Payer: Self-pay | Admitting: Certified Registered Nurse Anesthetist

## 2022-03-13 ENCOUNTER — Ambulatory Visit (AMBULATORY_SURGERY_CENTER): Payer: Medicare PPO | Admitting: Gastroenterology

## 2022-03-13 ENCOUNTER — Encounter: Payer: Self-pay | Admitting: Gastroenterology

## 2022-03-13 VITALS — BP 107/57 | HR 71 | Temp 98.2°F | Resp 17 | Ht 72.0 in | Wt 170.8 lb

## 2022-03-13 DIAGNOSIS — D122 Benign neoplasm of ascending colon: Secondary | ICD-10-CM

## 2022-03-13 DIAGNOSIS — Z86010 Personal history of colonic polyps: Secondary | ICD-10-CM | POA: Diagnosis not present

## 2022-03-13 DIAGNOSIS — Z09 Encounter for follow-up examination after completed treatment for conditions other than malignant neoplasm: Secondary | ICD-10-CM | POA: Diagnosis not present

## 2022-03-13 DIAGNOSIS — E7800 Pure hypercholesterolemia, unspecified: Secondary | ICD-10-CM | POA: Diagnosis not present

## 2022-03-13 MED ORDER — SODIUM CHLORIDE 0.9 % IV SOLN: 500.0000 mL | Freq: Once | INTRAVENOUS | Status: DC

## 2022-03-13 NOTE — Progress Notes (Signed)
Pt's states no medical or surgical changes since previsit or office visit. 

## 2022-03-13 NOTE — Progress Notes (Signed)
Report given to PACU, vss 

## 2022-03-13 NOTE — Patient Instructions (Signed)
Thank you for allowing [place] to care for you today! Resume previous diet and medications today! Return to your normal activities tomorrow. NO further routine colonoscopies recommended based on current age guidelines.   YOU HAD AN ENDOSCOPIC PROCEDURE TODAY AT [facility]:   Refer to the procedure report that was given to you for any specific questions about what was found during the examination.  If the procedure report does not answer your questions, please call your gastroenterologist to clarify.  If you requested that your care partner not be given the details of your procedure findings, then the procedure report has been included in a sealed envelope for you to review at your convenience later.  YOU SHOULD EXPECT: Some feelings of bloating in the abdomen. Passage of more gas than usual.  Walking can help get rid of the air that was put into your GI tract during the procedure and reduce the bloating. If you had a lower endoscopy (such as a colonoscopy or flexible sigmoidoscopy) you may notice spotting of blood in your stool or on the toilet paper. If you underwent a bowel prep for your procedure, you may not have a normal bowel movement for a few days.  Please Note:  You might notice some irritation and congestion in your nose or some drainage.  This is from the oxygen used during your procedure.  There is no need for concern and it should clear up in a day or so.  SYMPTOMS TO REPORT IMMEDIATELY:  Following lower endoscopy (colonoscopy or flexible sigmoidoscopy):  Excessive amounts of blood in the stool  Significant tenderness or worsening of abdominal pains  Swelling of the abdomen that is new, acute  Fever of 100F or high   For urgent or emergent issues, a gastroenterologist can be reached at any hour by calling [phone]. Do not use MyChart messaging for urgent concerns.    DIET:  We do recommend a small meal at first, but then you may proceed to your regular diet.   Drink plenty of fluids but you should avoid alcoholic beverages for 24 hours.  ACTIVITY:  You should plan to take it easy for the rest of today and you should NOT DRIVE or use heavy machinery until tomorrow (because of the sedation medicines used during the test).    FOLLOW UP: Our staff will call the number listed on your records the next business day following your procedure.  We will call around 7:15- 8:00 am to check on you and address any questions or concerns that you may have regarding the information given to you following your procedure. If we do not reach you, we will leave a message.  If you develop any symptoms (ie: fever, flu-like symptoms, shortness of breath, cough etc.) before then, please call [phone].  If you test positive for Covid 19 in the 2 weeks post procedure, please call and report this information to [place].    If any biopsies were taken you will be contacted by phone or by letter within the next 1-3 weeks.  Please call us at [phone] if you have not heard about the biopsies in 3 weeks.    SIGNATURES/CONFIDENTIALITY: You and/or your care partner have signed paperwork which will be entered into your electronic medical record.  These signatures attest to the fact that that the information above on your After Visit Summary has been reviewed and is understood.  Full responsibility of the confidentiality of this discharge information lies with you and/or your  care-partner.  

## 2022-03-13 NOTE — Op Note (Signed)
[facility] Patient Name: Paul King Procedure Date: 03/13/2022 2:04 PM MRN: [id number] Endoscopist: Mallie Mussel L. [name] , MD Age: 76 Referring MD:  Date of Birth: 25-Sep-1945 Gender: Male Account #: [id number] Procedure:                Colonoscopy Indications:              Surveillance: Personal history of adenomatous                            polyps on last colonoscopy 5 years ago                           27m TA in April 2018; SSP x 2 and TA x 3 (each <                            144m in 2015 Medicines:                Monitored Anesthesia Care Procedure:                Pre-Anesthesia Assessment:                           - Prior to the procedure, a History and Physical                            was performed, and patient medications and                            allergies were reviewed. The patient's tolerance of                            previous anesthesia was also reviewed. The risks                            and benefits of the procedure and the sedation                            options and risks were discussed with the patient.                            All questions were answered, and informed consent                            was obtained. Prior Anticoagulants: The patient has                            taken no previous anticoagulant or antiplatelet                            agents. ASA Grade Assessment: II - A patient with                            mild systemic disease. After reviewing the risks  and benefits, the patient was deemed in                            satisfactory condition to undergo the procedure.                           After obtaining informed consent, the colonoscope                            was passed under direct vision. Throughout the                            procedure, the patient's blood pressure, pulse, and                            oxygen saturations were monitored continuously. The                             CF [id number] #[id number] was introduced through the anus                            and advanced to the the cecum, identified by                            appendiceal orifice and ileocecal valve. The                            colonoscopy was somewhat difficult due to a                            redundant colon. Successful completion of the                            procedure was aided by using manual pressure and                            straightening and shortening the scope to obtain                            bowel loop reduction. The patient tolerated the                            procedure well. The quality of the bowel                            preparation was excellent. The ileocecal valve,                            appendiceal orifice, and rectum were photographed. Scope In: 2:19:29 PM Scope Out: 2:33:45 PM Scope Withdrawal Time: 0 hours 10 minutes 32 seconds  Total Procedure Duration: 0 hours 14 minutes 16 seconds  Findings:                 The perianal and digital rectal examinations were  normal.                           Repeat examination of right colon under NBI                            performed.                           A diminutive polyp was found in the ascending                            colon. The polyp was semi-sessile. The polyp was                            removed with a cold snare. Resection and retrieval                            were complete.                           Diverticula were found in the left colon and right                            colon.                           The exam was otherwise without abnormality on                            direct and retroflexion views. Complications:            No immediate complications. Estimated Blood Loss:     Estimated blood loss was minimal. Impression:               - One diminutive polyp in the ascending colon,                            removed with a cold snare.  Resected and retrieved.                           - Diverticulosis in the left colon and in the right                            colon.                           - The examination was otherwise normal on direct                            and retroflexion views. Recommendation:           - Patient has a contact number available for                            emergencies. The signs and symptoms of potential  delayed complications were discussed with the                            patient. Return to normal activities tomorrow.                            Written discharge instructions were provided to the                            patient.                           - Resume previous diet.                           - Continue present medications.                           - Await pathology results.                           - No repeat surveillance colonoscopy recommended                            due to age, current guidelines and low risk polyp                            history. [name] L. [name], MD 03/13/2022 2:40:00 PM This report has been signed electronically.

## 2022-03-13 NOTE — Progress Notes (Signed)
1438 Ephedrine 10 mg given IV due to low BP, MD updated.

## 2022-03-13 NOTE — Progress Notes (Signed)
History and Physical:  This patient presents for endoscopic testing for: Encounter Diagnosis  Name Primary?   Personal history of colonic polyps Yes    76 year old man here for surveillance colonoscopy.  Last colonoscopy April 2018, 22 mm tubular adenoma was removed in 2015 2 SSP's and 3 tubular adenomas removed, each less than millimeters in size  Patient is otherwise without complaints or active issues today.   Past Medical History: Past Medical History:  Diagnosis Date   Adenomatous colon polyp    tubular   Allergy    Basal cell carcinoma    Hyperlipidemia    Hypoprolactinemia ([place])    Melanoma ([place])    Osteopenia 2000   determined via bone scan   Osteoporosis    Palpitations      Past Surgical History: Past Surgical History:  Procedure Laterality Date   CARDIOVASCULAR STRESS TEST  04/23/2011   negative   COLONOSCOPY     FACIAL RECONSTRUCTION SURGERY  1996   [place]   POLYPECTOMY     TONSILLECTOMY  1951   US ECHOCARDIOGRAPHY  07/05/2012   mild-mod LA dilation,mild mitral insuff,mild AI    Allergies: Allergies  Allergen Reactions   Sulfamethoxazole Shortness Of Breath   Sulfonamide Derivatives     Outpatient Meds: Current Outpatient Medications  Medication Sig Dispense Refill   alendronate (FOSAMAX) 70 MG tablet Take 70 mg by mouth once a week. Take with a full glass of water on an empty stomach.     cabergoline (DOSTINEX) 0.5 MG tablet Take 0.25 mg by mouth 2 (two) times a week.     CALCIUM & MAGNESIUM CARBONATES PO Take 1,000 mg by mouth daily.     Cholecalciferol (VITAMIN D3) 1000 units CAPS Take by mouth daily in the afternoon.     ezetimibe (ZETIA) 10 MG tablet Take 10 mg by mouth daily.     Multiple Vitamins-Minerals (MULTIVITAMIN WITH MINERALS) tablet Take 1 tablet by mouth daily.     rosuvastatin (CRESTOR) 20 MG tablet Take 20 mg by mouth daily.  3   sildenafil (REVATIO) 20 MG tablet as needed.     hydrocortisone butyrate (LUCOID) 0.1 %  CREA cream Apply topically 2 (two) times daily as needed.     tazarotene (TAZORAC) 0.05 % cream Apply topically as needed.     Current Facility-Administered Medications  Medication Dose Route Frequency Provider Last Rate Last Admin   0.9 %  sodium chloride infusion  500 mL Intravenous Once Nelida Meuse III, MD          ___________________________________________________________________ Objective   Exam:  BP 119/81   Pulse 82   Temp 98.2 F (36.8 C) (Temporal)   Ht 6' (1.829 m)   Wt 170 lb 12.8 oz (77.5 kg)   SpO2 95%   BMI 23.16 kg/m   CV: RRR without murmur, S1/S2 Resp: clear to auscultation bilaterally, normal RR and effort noted GI: soft, no tenderness, with active bowel sounds.   Assessment: Encounter Diagnosis  Name Primary?   Personal history of colonic polyps Yes     Plan: Colonoscopy  The benefits and risks of the planned procedure were described in detail with the patient or (when appropriate) their health care proxy.  Risks were outlined as including, but not limited to, bleeding, infection, perforation, adverse medication reaction leading to cardiac or pulmonary decompensation, pancreatitis (if ERCP).  The limitation of incomplete mucosal visualization was also discussed.  No guarantees or warranties were given.    The  patient is appropriate for an endoscopic procedure in the ambulatory setting.   - Wilfrid Lund, MD

## 2022-03-13 NOTE — Progress Notes (Signed)
Called to room to assist during endoscopic procedure.  Patient ID and intended procedure confirmed with present staff. Received instructions for my participation in the procedure from the performing physician.  

## 2022-03-14 ENCOUNTER — Telehealth: Payer: Self-pay | Admitting: *Deleted

## 2022-03-14 NOTE — Telephone Encounter (Signed)
  Follow up Call-     03/13/2022    1:13 PM  Call back number  Post procedure Call Back phone  # [phone]  Permission to leave phone message Yes     Patient questions:  Do you have a fever, pain , or abdominal swelling? No. Pain Score  0 *  Have you tolerated food without any problems? Yes.    Have you been able to return to your normal activities? Yes.    Do you have any questions about your discharge instructions: Diet   No. Medications  No. Follow up visit  No.  Do you have questions or concerns about your Care? No.  Actions: * If pain score is 4 or above: No action needed, pain <4.

## 2022-03-21 ENCOUNTER — Encounter: Payer: Self-pay | Admitting: Gastroenterology

## 2022-05-01 DIAGNOSIS — R0981 Nasal congestion: Secondary | ICD-10-CM | POA: Diagnosis not present

## 2022-05-01 DIAGNOSIS — Z1152 Encounter for screening for COVID-19: Secondary | ICD-10-CM | POA: Diagnosis not present

## 2022-05-01 DIAGNOSIS — J029 Acute pharyngitis, unspecified: Secondary | ICD-10-CM | POA: Diagnosis not present

## 2022-05-01 DIAGNOSIS — R5383 Other fatigue: Secondary | ICD-10-CM | POA: Diagnosis not present

## 2022-05-01 DIAGNOSIS — R059 Cough, unspecified: Secondary | ICD-10-CM | POA: Diagnosis not present

## 2022-05-01 DIAGNOSIS — J069 Acute upper respiratory infection, unspecified: Secondary | ICD-10-CM | POA: Diagnosis not present

## 2022-06-25 DIAGNOSIS — N401 Enlarged prostate with lower urinary tract symptoms: Secondary | ICD-10-CM | POA: Diagnosis not present

## 2022-06-30 DIAGNOSIS — N401 Enlarged prostate with lower urinary tract symptoms: Secondary | ICD-10-CM | POA: Diagnosis not present

## 2022-06-30 DIAGNOSIS — N528 Other male erectile dysfunction: Secondary | ICD-10-CM | POA: Diagnosis not present

## 2022-06-30 DIAGNOSIS — R351 Nocturia: Secondary | ICD-10-CM | POA: Diagnosis not present

## 2022-08-04 DIAGNOSIS — Z1212 Encounter for screening for malignant neoplasm of rectum: Secondary | ICD-10-CM | POA: Diagnosis not present

## 2022-08-04 DIAGNOSIS — M810 Age-related osteoporosis without current pathological fracture: Secondary | ICD-10-CM | POA: Diagnosis not present

## 2022-08-04 DIAGNOSIS — E221 Hyperprolactinemia: Secondary | ICD-10-CM | POA: Diagnosis not present

## 2022-08-04 DIAGNOSIS — I700 Atherosclerosis of aorta: Secondary | ICD-10-CM | POA: Diagnosis not present

## 2022-08-04 DIAGNOSIS — E785 Hyperlipidemia, unspecified: Secondary | ICD-10-CM | POA: Diagnosis not present

## 2022-08-04 DIAGNOSIS — Z125 Encounter for screening for malignant neoplasm of prostate: Secondary | ICD-10-CM | POA: Diagnosis not present

## 2022-08-05 DIAGNOSIS — M8580 Other specified disorders of bone density and structure, unspecified site: Secondary | ICD-10-CM | POA: Diagnosis not present

## 2022-08-05 DIAGNOSIS — Z79899 Other long term (current) drug therapy: Secondary | ICD-10-CM | POA: Diagnosis not present

## 2022-08-05 DIAGNOSIS — E221 Hyperprolactinemia: Secondary | ICD-10-CM | POA: Diagnosis not present

## 2022-08-07 DIAGNOSIS — H2513 Age-related nuclear cataract, bilateral: Secondary | ICD-10-CM | POA: Diagnosis not present

## 2022-08-07 DIAGNOSIS — H5203 Hypermetropia, bilateral: Secondary | ICD-10-CM | POA: Diagnosis not present

## 2022-08-11 DIAGNOSIS — Z0000 Encounter for general adult medical examination without abnormal findings: Secondary | ICD-10-CM | POA: Diagnosis not present

## 2022-08-11 DIAGNOSIS — I2510 Atherosclerotic heart disease of native coronary artery without angina pectoris: Secondary | ICD-10-CM | POA: Diagnosis not present

## 2022-08-11 DIAGNOSIS — I700 Atherosclerosis of aorta: Secondary | ICD-10-CM | POA: Diagnosis not present

## 2022-08-11 DIAGNOSIS — R82998 Other abnormal findings in urine: Secondary | ICD-10-CM | POA: Diagnosis not present

## 2022-08-11 DIAGNOSIS — G629 Polyneuropathy, unspecified: Secondary | ICD-10-CM | POA: Diagnosis not present

## 2022-08-11 DIAGNOSIS — Z1331 Encounter for screening for depression: Secondary | ICD-10-CM | POA: Diagnosis not present

## 2022-08-11 DIAGNOSIS — M810 Age-related osteoporosis without current pathological fracture: Secondary | ICD-10-CM | POA: Diagnosis not present

## 2022-08-11 DIAGNOSIS — Z1339 Encounter for screening examination for other mental health and behavioral disorders: Secondary | ICD-10-CM | POA: Diagnosis not present

## 2022-08-11 DIAGNOSIS — E785 Hyperlipidemia, unspecified: Secondary | ICD-10-CM | POA: Diagnosis not present

## 2022-08-11 DIAGNOSIS — N529 Male erectile dysfunction, unspecified: Secondary | ICD-10-CM | POA: Diagnosis not present

## 2022-08-21 DIAGNOSIS — L570 Actinic keratosis: Secondary | ICD-10-CM | POA: Diagnosis not present

## 2022-08-21 DIAGNOSIS — D1801 Hemangioma of skin and subcutaneous tissue: Secondary | ICD-10-CM | POA: Diagnosis not present

## 2022-08-21 DIAGNOSIS — L812 Freckles: Secondary | ICD-10-CM | POA: Diagnosis not present

## 2022-08-21 DIAGNOSIS — Z85828 Personal history of other malignant neoplasm of skin: Secondary | ICD-10-CM | POA: Diagnosis not present

## 2022-08-21 DIAGNOSIS — L821 Other seborrheic keratosis: Secondary | ICD-10-CM | POA: Diagnosis not present

## 2022-08-21 DIAGNOSIS — L820 Inflamed seborrheic keratosis: Secondary | ICD-10-CM | POA: Diagnosis not present

## 2022-09-02 ENCOUNTER — Other Ambulatory Visit: Payer: Self-pay | Admitting: Internal Medicine

## 2022-09-02 DIAGNOSIS — M8580 Other specified disorders of bone density and structure, unspecified site: Secondary | ICD-10-CM

## 2022-10-30 ENCOUNTER — Telehealth: Payer: Self-pay

## 2022-10-30 NOTE — Telephone Encounter (Signed)
Left a voice message for patient to give our office a call back to reschedule his 11/17/22 appointment with Dr. [name] to 11/05/22.

## 2022-11-05 ENCOUNTER — Encounter: Payer: Self-pay | Admitting: Cardiovascular Disease

## 2022-11-05 ENCOUNTER — Ambulatory Visit: Payer: Medicare PPO | Attending: Cardiovascular Disease | Admitting: Cardiovascular Disease

## 2022-11-05 VITALS — BP 112/72 | HR 64 | Ht 71.0 in | Wt 170.4 lb

## 2022-11-05 DIAGNOSIS — I493 Ventricular premature depolarization: Secondary | ICD-10-CM

## 2022-11-05 DIAGNOSIS — E782 Mixed hyperlipidemia: Secondary | ICD-10-CM

## 2022-11-05 DIAGNOSIS — I2510 Atherosclerotic heart disease of native coronary artery without angina pectoris: Secondary | ICD-10-CM | POA: Diagnosis not present

## 2022-11-05 NOTE — Patient Instructions (Signed)
Medication Instructions:  No changes *If you need a refill on your cardiac medications before your next appointment, please call your pharmacy*  Follow-Up: At [facility], you and your health needs are our priority.  As part of our continuing mission to provide you with exceptional heart care, we have created designated Provider Care Teams.  These Care Teams include your primary Cardiologist (physician) and Advanced Practice Providers (APPs -  Physician Assistants and Nurse Practitioners) who all work together to provide you with the care you need, when you need it.  We recommend signing up for the patient portal called "MyChart".  Sign up information is provided on this After Visit Summary.  MyChart is used to connect with patients for Virtual Visits (Telemedicine).  Patients are able to view lab/test results, encounter notes, upcoming appointments, etc.  Non-urgent messages can be sent to your provider as well.   To learn more about what you can do with MyChart, go to [email or web address].    Your next appointment:   1 year(s)  Provider:   Sanda Klein, MD

## 2022-11-05 NOTE — Progress Notes (Unsigned)
Cardiology Office Note    Date:  11/06/2022   ID:  Paul King, DOB Jan 24, 1946, MRN [id number]  PCP:  Crist Infante, MD  Cardiologist:   Sanda Klein, MD   Chief Complaint  Patient presents with   Irregular Heart Beat    History of Present Illness:  Paul King is a 77 y.o. male with history of PVCs, mixed hyperlipidemia, hyperprolactinemia, questionable diagnosis of mitral valve prolapse returning for follow-up.   He is doing quite well.  He is very rarely bothered by palpitations.  He continues to play golf several times a week.  He is planning another trip to [place] in July.  The patient specifically denies any chest pain at rest exertion, dyspnea at rest or with exertion, orthopnea, paroxysmal nocturnal dyspnea, syncope, palpitations, focal neurological deficits, intermittent claudication, lower extremity edema, unexplained weight gain, cough, hemoptysis or wheezing.  Metabolic control is good with an LDL cholesterol 64.  He has normal thyroid and renal function tests.  Multiple imaging studies over the years (performed for evaluation of lung disease) have shown evidence of coronary and aortic atherosclerosis.  However, a coronary calcium score formally showed his burden of atherosclerosis to be quite average (score 2025, 48 percentile).  The aorta was normal in caliber.  He has been on treatment with rosuvastatin with good lipid levels for several years now.  Normal treadmill stress test in 2012.  Past Medical History:  Diagnosis Date   Adenomatous colon polyp    tubular   Allergy    Basal cell carcinoma    Hyperlipidemia    Hypoprolactinemia ([place])    Melanoma ([place])    Osteopenia 2000   determined via bone scan   Osteoporosis    Palpitations     Past Surgical History:  Procedure Laterality Date   CARDIOVASCULAR STRESS TEST  04/23/2011   negative   COLONOSCOPY     FACIAL RECONSTRUCTION SURGERY  1996   MOHS SURGERY  1996   POLYPECTOMY     TONSILLECTOMY  1951    US ECHOCARDIOGRAPHY  07/05/2012   mild-mod LA dilation,mild mitral insuff,mild AI    Current Medications: Outpatient Medications Prior to Visit  Medication Sig Dispense Refill   alendronate (FOSAMAX) 70 MG tablet Take 70 mg by mouth once a week. Take with a full glass of water on an empty stomach.     cabergoline (DOSTINEX) 0.5 MG tablet Take 0.25 mg by mouth 2 (two) times a week.     CALCIUM & MAGNESIUM CARBONATES PO Take 1,000 mg by mouth daily.     Cholecalciferol (VITAMIN D3) 1000 units CAPS Take by mouth daily in the afternoon.     ezetimibe (ZETIA) 10 MG tablet Take 10 mg by mouth daily.     Multiple Vitamins-Minerals (MULTIVITAMIN WITH MINERALS) tablet Take 1 tablet by mouth daily.     rosuvastatin (CRESTOR) 20 MG tablet Take 20 mg by mouth daily.  3   sildenafil (REVATIO) 20 MG tablet as needed.     Cholecalciferol 50 MCG (2000 UT) TABS Take 1 tablet by mouth daily. (Patient not taking: Reported on 11/05/2022)     hydrocortisone butyrate (LUCOID) 0.1 % CREA cream Apply topically 2 (two) times daily as needed. (Patient not taking: Reported on 11/05/2022)     Meclizine HCl 25 MG CHEW Chew 25 mg by mouth as needed. (Patient not taking: Reported on 11/05/2022)     tazarotene (TAZORAC) 0.05 % cream Apply topically as needed. (Patient not taking: Reported on 11/05/2022)  No facility-administered medications prior to visit.     Allergies:   Sulfamethoxazole and Sulfonamide derivatives   Social History   Socioeconomic History   Marital status: Married    Spouse name: Not on file   Number of children: 2   Years of education: Not on file   Highest education level: Not on file  Occupational History   Occupation: retired  Tobacco Use   Smoking status: Never   Smokeless tobacco: Never  Substance and Sexual Activity   Alcohol use: Yes    Alcohol/week: 0.0 standard drinks of alcohol    Comment: 4 glasses of wine a week   Drug use: No   Sexual activity: Not on file  Other Topics  Concern   Not on file  Social History Narrative   Not on file   Social Determinants of Health   Financial Resource Strain: Not on file  Food Insecurity: Not on file  Transportation Needs: Not on file  Physical Activity: Not on file  Stress: Not on file  Social Connections: Not on file     Family History:  The patient's family history includes Heart failure in his mother; Hypertension in his mother; Leukemia in his maternal grandfather; Stroke in his father, maternal grandmother, and mother.   ROS:   Please see the history of present illness.    ROS  All other systems are reviewed and are negative.  PHYSICAL EXAM:   VS:  BP 112/72 (BP Location: Left Arm, Patient Position: Sitting, Cuff Size: Normal)   Pulse 64   Ht '5\' 11"'$  (1.803 m)   Wt 170 lb 6.4 oz (77.3 kg)   SpO2 96%   BMI 23.77 kg/m      General: Alert, oriented x3, no distress, appears very fit and younger than stated age Head: no evidence of trauma, PERRL, EOMI, no exophtalmos or lid lag, no myxedema, no xanthelasma; normal ears, nose and oropharynx Neck: normal jugular venous pulsations and no hepatojugular reflux; brisk carotid pulses without delay and no carotid bruits Chest: clear to auscultation, no signs of consolidation by percussion or palpation, normal fremitus, symmetrical and full respiratory excursions Cardiovascular: normal position and quality of the apical impulse, regular rhythm, normal first and second heart sounds, no murmurs, rubs or gallops Abdomen: no tenderness or distention, no masses by palpation, no abnormal pulsatility or arterial bruits, normal bowel sounds, no hepatosplenomegaly Extremities: no clubbing, cyanosis or edema; 2+ radial, ulnar and brachial pulses bilaterally; 2+ right femoral, posterior tibial and dorsalis pedis pulses; 2+ left femoral, posterior tibial and dorsalis pedis pulses; no subclavian or femoral bruits Neurological: grossly nonfocal Psych: Normal mood and  affect     Wt Readings from Last 3 Encounters:  11/05/22 170 lb 6.4 oz (77.3 kg)  03/13/22 170 lb 12.8 oz (77.5 kg)  02/20/22 170 lb (77.1 kg)      Studies/Labs Reviewed:   EKG:  EKG is ordered today.  Shows normal sinus rhythm, normal tracing, QTc 431 ms Recent Labs:/ Dr. Joylene Draft labs from Oct 2020 Total cholesterol 157, HDL 49, LDL 83, triglycerides 74 Creatinine 0.9, hemoglobin 15.1, normal liver function tests  06/20/2020 Total cholesterol 144, HDL 44, LDL 82, triglycerides 88, creatinine 0.9, potassium 4.3  08/04/2022 Cholesterol 134, HDL 48, LDL 64, triglycerides 100  Potassium 4.2, TSH 2.04, ALT 19  Lipid Panel    Component Value Date/Time   CHOL 231 (H) 07/31/2014 1735   TRIG 233 (H) 07/31/2014 1735   HDL 46 07/31/2014 1735   CHOLHDL  5.0 07/31/2014 1735   VLDL 47 (H) 07/31/2014 1735   LDLCALC 138 (H) 07/31/2014 1735    ASSESSMENT:    1. PVCs (premature ventricular contractions)   2. Coronary artery calcification seen on CT scan   3. Mixed hyperlipidemia       PLAN:  In order of problems listed above:   PVCs: Minimally symptomatic.  No significant structural heart disease.  Prefers not to take medical therapy for this. HLP: His coronary calcium score is very much average.  Current LDL cholesterol is 64, well within target range.  Eats a very healthy diet and is physically active.  Continue rosuvastatin.    Medication Adjustments/Labs and Tests Ordered: Current medicines are reviewed at length with the patient today.  Concerns regarding medicines are outlined above.  Medication changes, Labs and Tests ordered today are listed in the Patient Instructions below. Patient Instructions  Medication Instructions:  No changes *If you need a refill on your cardiac medications before your next appointment, please call your pharmacy*  Follow-Up: At [facility], you and your health needs are our priority.  As part of our continuing mission to provide  you with exceptional heart care, we have created designated Provider Care Teams.  These Care Teams include your primary Cardiologist (physician) and Advanced Practice Providers (APPs -  Physician Assistants and Nurse Practitioners) who all work together to provide you with the care you need, when you need it.  We recommend signing up for the patient portal called "MyChart".  Sign up information is provided on this After Visit Summary.  MyChart is used to connect with patients for Virtual Visits (Telemedicine).  Patients are able to view lab/test results, encounter notes, upcoming appointments, etc.  Non-urgent messages can be sent to your provider as well.   To learn more about what you can do with MyChart, go to [email or web address].    Your next appointment:   1 year(s)  Provider:   Sanda Klein, MD        Signed, Sanda Klein, MD  11/06/2022 8:02 AM    [facility] [address], [place], [place]  [address] Phone: [phone]; Fax: [phone]

## 2022-11-17 ENCOUNTER — Ambulatory Visit: Payer: Medicare PPO | Admitting: Cardiovascular Disease

## 2023-02-25 DIAGNOSIS — L812 Freckles: Secondary | ICD-10-CM | POA: Diagnosis not present

## 2023-02-25 DIAGNOSIS — L821 Other seborrheic keratosis: Secondary | ICD-10-CM | POA: Diagnosis not present

## 2023-02-25 DIAGNOSIS — D2272 Melanocytic nevi of left lower limb, including hip: Secondary | ICD-10-CM | POA: Diagnosis not present

## 2023-02-25 DIAGNOSIS — L820 Inflamed seborrheic keratosis: Secondary | ICD-10-CM | POA: Diagnosis not present

## 2023-02-25 DIAGNOSIS — Z85828 Personal history of other malignant neoplasm of skin: Secondary | ICD-10-CM | POA: Diagnosis not present

## 2023-02-25 DIAGNOSIS — L720 Epidermal cyst: Secondary | ICD-10-CM | POA: Diagnosis not present

## 2023-02-25 DIAGNOSIS — D2271 Melanocytic nevi of right lower limb, including hip: Secondary | ICD-10-CM | POA: Diagnosis not present

## 2023-02-25 DIAGNOSIS — L570 Actinic keratosis: Secondary | ICD-10-CM | POA: Diagnosis not present

## 2023-04-30 DIAGNOSIS — M25552 Pain in left hip: Secondary | ICD-10-CM | POA: Diagnosis not present

## 2023-04-30 DIAGNOSIS — M5417 Radiculopathy, lumbosacral region: Secondary | ICD-10-CM | POA: Diagnosis not present

## 2023-05-06 DIAGNOSIS — M5417 Radiculopathy, lumbosacral region: Secondary | ICD-10-CM | POA: Diagnosis not present

## 2023-05-13 DIAGNOSIS — M5417 Radiculopathy, lumbosacral region: Secondary | ICD-10-CM | POA: Diagnosis not present

## 2023-05-20 DIAGNOSIS — M5417 Radiculopathy, lumbosacral region: Secondary | ICD-10-CM | POA: Diagnosis not present

## 2023-05-27 DIAGNOSIS — M5417 Radiculopathy, lumbosacral region: Secondary | ICD-10-CM | POA: Diagnosis not present

## 2023-06-01 DIAGNOSIS — M5417 Radiculopathy, lumbosacral region: Secondary | ICD-10-CM | POA: Diagnosis not present

## 2023-06-08 ENCOUNTER — Inpatient Hospital Stay
Admission: RE | Admit: 2023-06-08 | Discharge: 2023-06-08 | Disposition: A | Discharge status: Home / Self Care | Payer: Medicare PPO | Source: Ambulatory Visit | Attending: Internal Medicine | Admitting: Internal Medicine

## 2023-06-08 DIAGNOSIS — M8580 Other specified disorders of bone density and structure, unspecified site: Secondary | ICD-10-CM

## 2023-06-08 DIAGNOSIS — M8588 Other specified disorders of bone density and structure, other site: Secondary | ICD-10-CM | POA: Diagnosis not present

## 2023-06-08 DIAGNOSIS — R29890 Loss of height: Secondary | ICD-10-CM | POA: Diagnosis not present

## 2023-06-08 DIAGNOSIS — N958 Other specified menopausal and perimenopausal disorders: Secondary | ICD-10-CM | POA: Diagnosis not present

## 2023-06-08 DIAGNOSIS — M5417 Radiculopathy, lumbosacral region: Secondary | ICD-10-CM | POA: Diagnosis not present

## 2023-06-16 DIAGNOSIS — M5417 Radiculopathy, lumbosacral region: Secondary | ICD-10-CM | POA: Diagnosis not present

## 2023-06-24 DIAGNOSIS — M5417 Radiculopathy, lumbosacral region: Secondary | ICD-10-CM | POA: Diagnosis not present

## 2023-06-25 IMAGING — CT CT CARDIAC CORONARY ARTERY CALCIUM SCORE
3 series · 14 of 20 positions shown, 16 images · non-contrast
Comparison: Chest CT 11/19/2020.

CLINICAL DATA: 75-year-old Caucasian male with history of
hyperlipidemia.

EXAM:
CT CARDIAC CORONARY ARTERY CALCIUM SCORE
TECHNIQUE: Non-contrast imaging through the heart was performed using
prospective ECG gating. Image post processing was performed on an
independent workstation, allowing for quantitative analysis of the
heart and coronary arteries. Note that this exam targets the heart
and the chest was not imaged in its entirety.

[Series 2: calcium scoring 2.00 qr36 bestdiast 68% hrt calciu · axial · 0.41mm/px · z∈[+1560,+1630]mm · 4 of 59 slices shown]
[im 12/59  vessel]
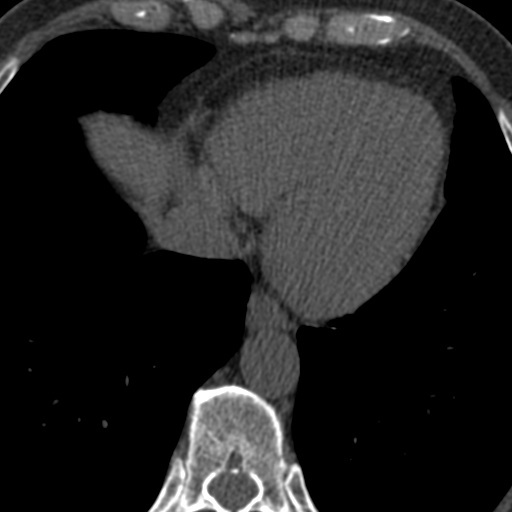
[im 24/59  vessel]
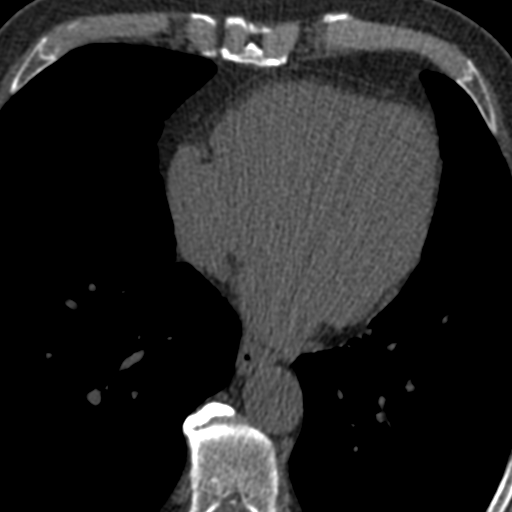
[im 35/59  vessel]
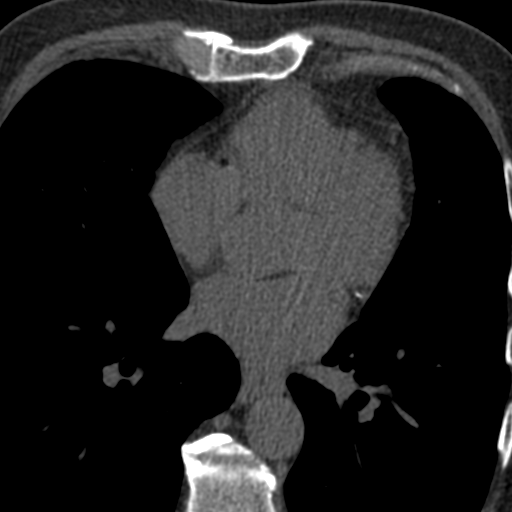
[im 47/59  vessel]
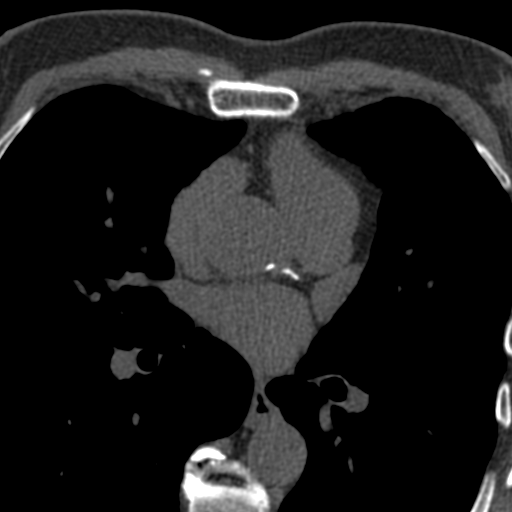

[Series 3: calcium scoring 2.00 br40 bestdiast 68% axial · axial · 0.64mm/px · z∈[+1541,+1645]mm · 5 of 80 slices shown, 7 images]
[im 14/80  vessel]
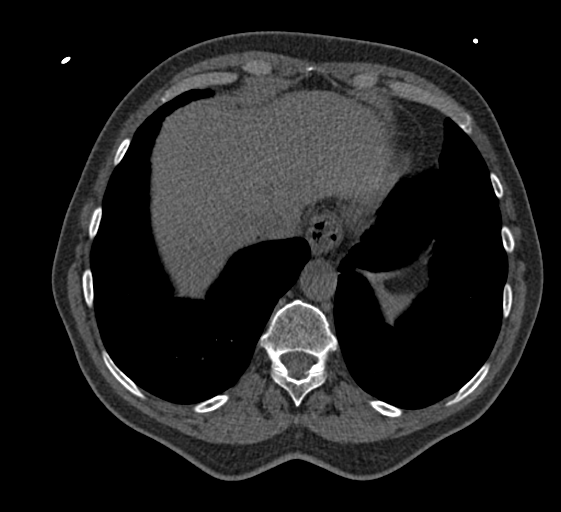
[im 14/80  lung]
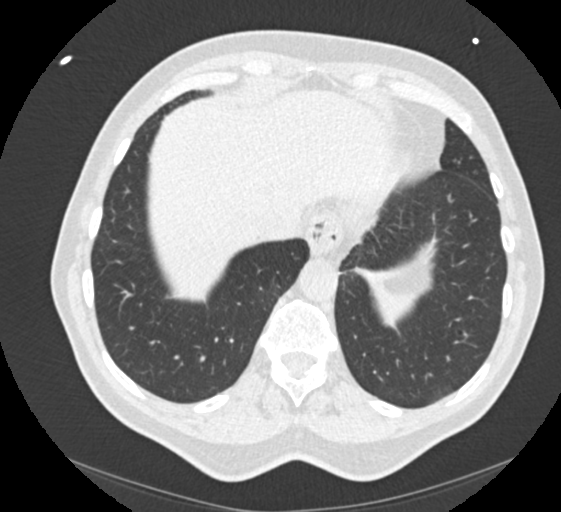
[im 27/80  vessel]
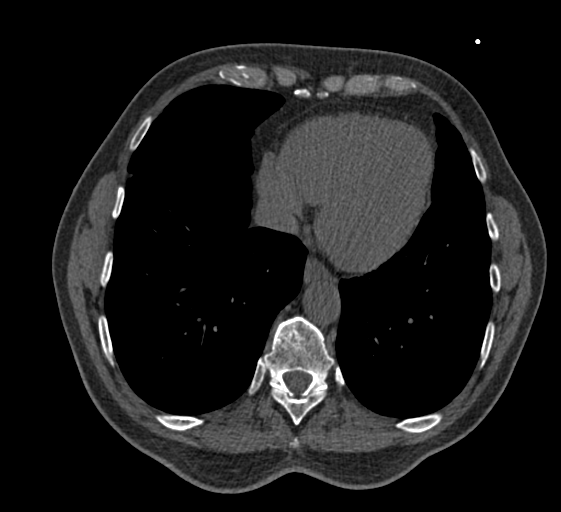
[im 40/80  vessel]
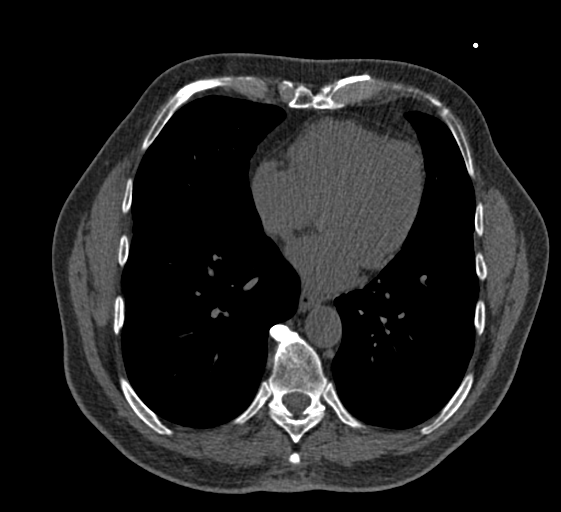
[im 53/80  vessel]
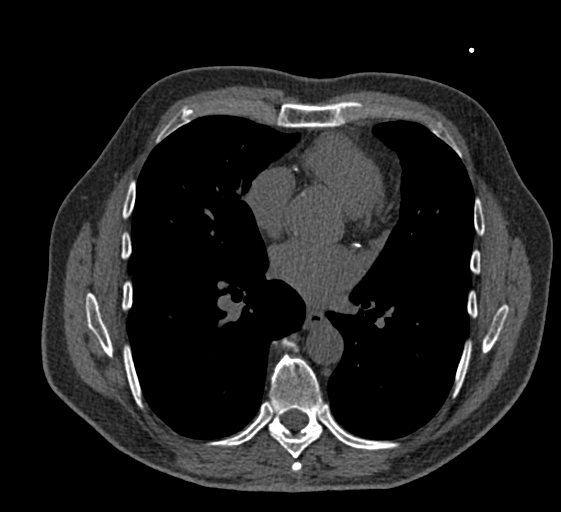
[im 66/80  vessel]
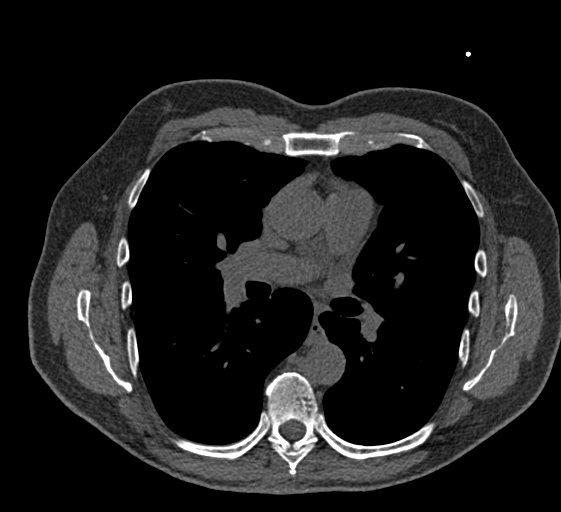
[im 66/80  lung]
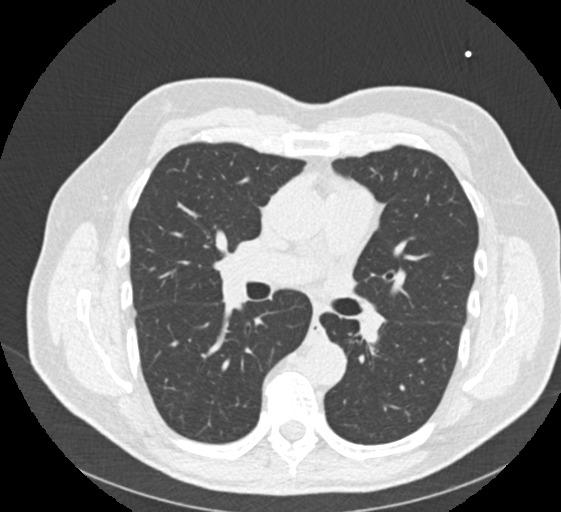

[Series 9: calcium scoring 2.00 br60 bestdiast 68% lungs · axial · 0.64mm/px · z∈[+1541,+1645]mm · 5 of 80 slices shown]
[im 14/80  vessel]
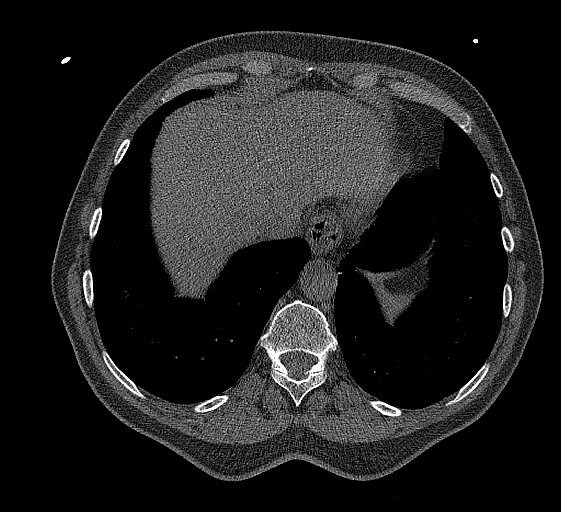
[im 27/80  vessel]
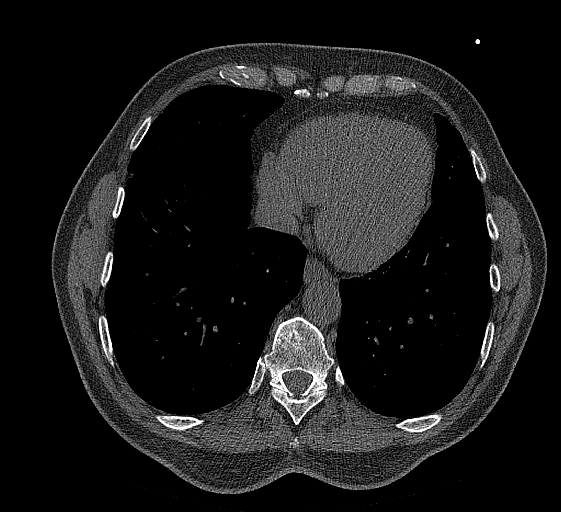
[im 40/80  vessel]
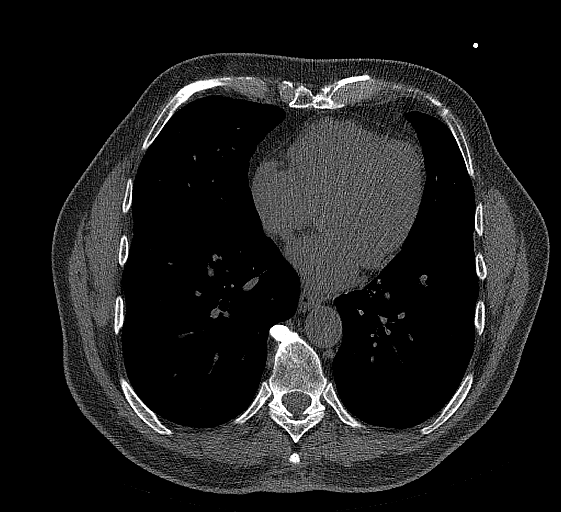
[im 53/80  vessel]
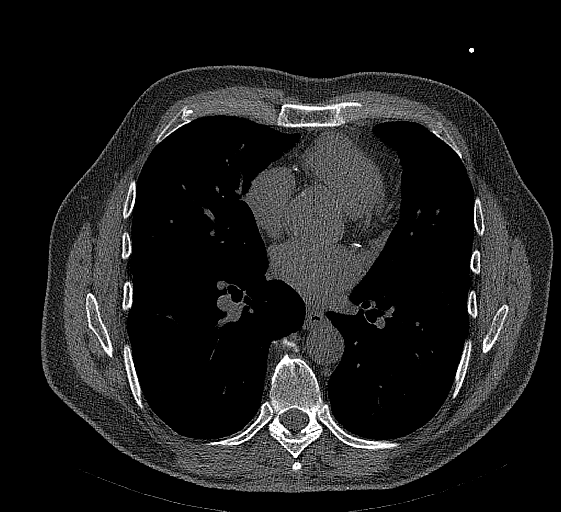
[im 66/80  vessel]
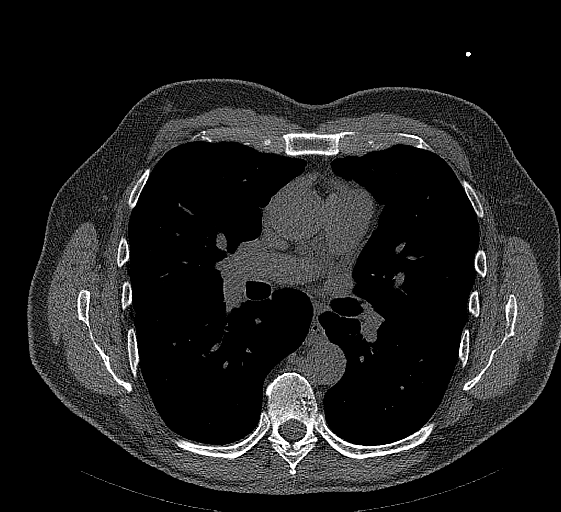

[14 of 20 positions shown; findings below may reference images not displayed]

FINDINGS: CORONARY CALCIUM SCORES:

Left Main: 41

LAD: 46

LCx: 87

RCA: 51

Total Agatston Score: 225

[HOSPITAL] percentile: 48th

AORTA MEASUREMENTS:

Ascending Aorta: 38 mm

Descending Aorta: 27 mm

EXTRACARDIAC FINDINGS:

Atherosclerotic calcifications in the thoracic aorta. Large densely
calcified anterior mediastinal lymph node incidentally noted. Within
the visualized portions of the thorax there are no suspicious
appearing pulmonary nodules or masses, there is no acute
consolidative airspace disease, no pleural effusions, no
pneumothorax and no other lymphadenopathy. Small left-sided
Bochdalek's hernia incidentally noted. Visualized portions of the
upper abdomen demonstrate low-attenuation lesions in the liver,
incompletely characterized on today's non-contrast CT examination,
but statistically likely to represent small cysts. There are no
aggressive appearing lytic or blastic lesions noted in the
visualized portions of the skeleton.
IMPRESSION: 1. Patient's total coronary artery calcium score is 225 which is
forty-eighth percentile for patient's of matched age, gender and
race/ethnicity. Please note that although the presence of coronary
artery calcium documents the presence of coronary artery disease,
the severity of this disease and any potential stenosis cannot be
assessed on this noncontrast CT examination. Assessment for
potential risk factor modification, dietary therapy or pharmacologic
therapy may be warranted, if clinically indicated.
2.  Aortic Atherosclerosis ([id number]-SHP.P).

## 2023-07-01 DIAGNOSIS — M5417 Radiculopathy, lumbosacral region: Secondary | ICD-10-CM | POA: Diagnosis not present

## 2023-07-07 DIAGNOSIS — M5417 Radiculopathy, lumbosacral region: Secondary | ICD-10-CM | POA: Diagnosis not present

## 2023-07-15 DIAGNOSIS — M5417 Radiculopathy, lumbosacral region: Secondary | ICD-10-CM | POA: Diagnosis not present

## 2023-07-24 DIAGNOSIS — M5417 Radiculopathy, lumbosacral region: Secondary | ICD-10-CM | POA: Diagnosis not present

## 2023-07-29 DIAGNOSIS — M5417 Radiculopathy, lumbosacral region: Secondary | ICD-10-CM | POA: Diagnosis not present

## 2023-08-05 DIAGNOSIS — M5417 Radiculopathy, lumbosacral region: Secondary | ICD-10-CM | POA: Diagnosis not present

## 2023-08-12 DIAGNOSIS — M5417 Radiculopathy, lumbosacral region: Secondary | ICD-10-CM | POA: Diagnosis not present

## 2023-08-17 DIAGNOSIS — M5417 Radiculopathy, lumbosacral region: Secondary | ICD-10-CM | POA: Diagnosis not present

## 2023-08-18 DIAGNOSIS — E221 Hyperprolactinemia: Secondary | ICD-10-CM | POA: Diagnosis not present

## 2023-08-18 DIAGNOSIS — M8580 Other specified disorders of bone density and structure, unspecified site: Secondary | ICD-10-CM | POA: Diagnosis not present

## 2023-08-25 DIAGNOSIS — M5417 Radiculopathy, lumbosacral region: Secondary | ICD-10-CM | POA: Diagnosis not present

## 2023-09-01 DIAGNOSIS — M5417 Radiculopathy, lumbosacral region: Secondary | ICD-10-CM | POA: Diagnosis not present

## 2023-09-08 DIAGNOSIS — L718 Other rosacea: Secondary | ICD-10-CM | POA: Diagnosis not present

## 2023-09-08 DIAGNOSIS — D2271 Melanocytic nevi of right lower limb, including hip: Secondary | ICD-10-CM | POA: Diagnosis not present

## 2023-09-08 DIAGNOSIS — L570 Actinic keratosis: Secondary | ICD-10-CM | POA: Diagnosis not present

## 2023-09-08 DIAGNOSIS — L738 Other specified follicular disorders: Secondary | ICD-10-CM | POA: Diagnosis not present

## 2023-09-08 DIAGNOSIS — L821 Other seborrheic keratosis: Secondary | ICD-10-CM | POA: Diagnosis not present

## 2023-09-08 DIAGNOSIS — L820 Inflamed seborrheic keratosis: Secondary | ICD-10-CM | POA: Diagnosis not present

## 2023-09-08 DIAGNOSIS — L812 Freckles: Secondary | ICD-10-CM | POA: Diagnosis not present

## 2023-09-08 DIAGNOSIS — Z85828 Personal history of other malignant neoplasm of skin: Secondary | ICD-10-CM | POA: Diagnosis not present

## 2023-09-24 DIAGNOSIS — H2513 Age-related nuclear cataract, bilateral: Secondary | ICD-10-CM | POA: Diagnosis not present

## 2023-09-24 DIAGNOSIS — H5203 Hypermetropia, bilateral: Secondary | ICD-10-CM | POA: Diagnosis not present

## 2023-10-21 DIAGNOSIS — Z1212 Encounter for screening for malignant neoplasm of rectum: Secondary | ICD-10-CM | POA: Diagnosis not present

## 2023-10-21 DIAGNOSIS — Z125 Encounter for screening for malignant neoplasm of prostate: Secondary | ICD-10-CM | POA: Diagnosis not present

## 2023-10-21 DIAGNOSIS — E785 Hyperlipidemia, unspecified: Secondary | ICD-10-CM | POA: Diagnosis not present

## 2023-10-21 DIAGNOSIS — E221 Hyperprolactinemia: Secondary | ICD-10-CM | POA: Diagnosis not present

## 2023-10-21 DIAGNOSIS — M810 Age-related osteoporosis without current pathological fracture: Secondary | ICD-10-CM | POA: Diagnosis not present

## 2023-10-21 DIAGNOSIS — I2510 Atherosclerotic heart disease of native coronary artery without angina pectoris: Secondary | ICD-10-CM | POA: Diagnosis not present

## 2023-10-28 DIAGNOSIS — M810 Age-related osteoporosis without current pathological fracture: Secondary | ICD-10-CM | POA: Diagnosis not present

## 2023-10-28 DIAGNOSIS — J479 Bronchiectasis, uncomplicated: Secondary | ICD-10-CM | POA: Diagnosis not present

## 2023-10-28 DIAGNOSIS — I2510 Atherosclerotic heart disease of native coronary artery without angina pectoris: Secondary | ICD-10-CM | POA: Diagnosis not present

## 2023-10-28 DIAGNOSIS — E221 Hyperprolactinemia: Secondary | ICD-10-CM | POA: Diagnosis not present

## 2023-10-28 DIAGNOSIS — R82998 Other abnormal findings in urine: Secondary | ICD-10-CM | POA: Diagnosis not present

## 2023-10-28 DIAGNOSIS — C439 Malignant melanoma of skin, unspecified: Secondary | ICD-10-CM | POA: Diagnosis not present

## 2023-10-28 DIAGNOSIS — E785 Hyperlipidemia, unspecified: Secondary | ICD-10-CM | POA: Diagnosis not present

## 2023-10-28 DIAGNOSIS — I700 Atherosclerosis of aorta: Secondary | ICD-10-CM | POA: Diagnosis not present

## 2023-10-28 DIAGNOSIS — Z0000 Encounter for general adult medical examination without abnormal findings: Secondary | ICD-10-CM | POA: Diagnosis not present

## 2023-11-20 ENCOUNTER — Ambulatory Visit: Payer: Medicare PPO | Attending: Cardiovascular Disease | Admitting: Cardiovascular Disease

## 2023-11-20 ENCOUNTER — Encounter: Payer: Self-pay | Admitting: Cardiovascular Disease

## 2023-11-20 VITALS — BP 110/62 | HR 74 | Ht 71.5 in | Wt 172.2 lb

## 2023-11-20 DIAGNOSIS — E7800 Pure hypercholesterolemia, unspecified: Secondary | ICD-10-CM | POA: Diagnosis not present

## 2023-11-20 DIAGNOSIS — I493 Ventricular premature depolarization: Secondary | ICD-10-CM

## 2023-11-20 NOTE — Patient Instructions (Signed)
 Medication Instructions:  Your physician recommends that you continue on your current medications as directed. Please refer to the Current Medication list given to you today.  *If you need a refill on your cardiac medications before your next appointment, please call your pharmacy*   Lab Work: None ordered  If you have any lab test that is abnormal or we need to change your treatment, we will call you to review the results.   Testing/Procedures: None ordered   Follow-Up: At [facility], you and your health needs are our priority.  As part of our continuing mission to provide you with exceptional heart care, we have created designated Provider Care Teams.  These Care Teams include your primary Cardiologist (physician) and Advanced Practice Providers (APPs -  Physician Assistants and Nurse Practitioners) who all work together to provide you with the care you need, when you need it.  Your next appointment:   as  needed  The format for your next appointment:   In Person  Provider:   Thurmon Fair, MD {   Thank you for choosing [facility]!!   [phone]  Other Instructions    1st Floor: - Lobby - Registration  - Pharmacy  - Lab - Cafe  2nd Floor: - PV Lab - Diagnostic Testing (echo, CT, nuclear med)  3rd Floor: - Vacant  4th Floor: - TCTS (cardiothoracic surgery) - AFib Clinic - Structural Heart Clinic - Vascular Surgery  - Vascular Ultrasound  5th Floor: - HeartCare Cardiology (general and EP) - Clinical Pharmacy for coumadin, hypertension, lipid, weight-loss medications, and med management appointments    Valet parking services will be available as well.

## 2023-11-20 NOTE — Progress Notes (Signed)
 Cardiology Office Note    Date:  11/21/2023   ID:  Paul King 01-Apr-1946, MRN [id number]  PCP:  Rodrigo Ran, MD  Cardiologist:   Thurmon Fair, MD   Chief Complaint  Patient presents with   Palpitations    History of Present Illness:  Paul King is a 78 y.o. male with history of PVCs, mixed hyperlipidemia, hyperprolactinemia, coronary artery calcifications, questionable diagnosis of mitral valve prolapse returning for follow-up.   He continues to do quite well.  He is rarely troubled by palpitations, only in the evening when he lays down in bed and only if lying on the left side.  If he turns over he no longer is aware.  He remains physically active, playing golf regularly.  He does not have any trips to [place] planned this year, but has [place] strips for some of his friends.  The patient specifically denies any chest pain at rest or with exertion, dyspnea at rest or with exertion, orthopnea, paroxysmal nocturnal dyspnea, syncope, palpitations, focal neurological deficits, intermittent claudication, lower extremity edema, unexplained weight gain, cough, hemoptysis or wheezing.   Continues to have very good metabolic control with HDL of 41 and LDL of 52, normal triglycerides, normal renal function and thyroid function tests.  Multiple imaging studies over the years (performed for evaluation of lung disease) have shown evidence of coronary and aortic atherosclerosis.  However, a 2022 coronary calcium score formally showed his burden of atherosclerosis to be quite average (score 225, 48 percentile).  The aorta was normal in caliber.  He has been on treatment with rosuvastatin with good lipid levels for several years now.  Normal treadmill stress test in 2012.  Past Medical History:  Diagnosis Date   Adenomatous colon polyp    tubular   Allergy    Basal cell carcinoma    Hyperlipidemia    Hypoprolactinemia (HCC)    Melanoma (HCC)    Osteopenia 2000   determined via  bone scan   Osteoporosis    Palpitations     Past Surgical History:  Procedure Laterality Date   CARDIOVASCULAR STRESS TEST  04/23/2011   negative   COLONOSCOPY     FACIAL RECONSTRUCTION SURGERY  1996   MOHS SURGERY  1996   POLYPECTOMY     TONSILLECTOMY  1951   US ECHOCARDIOGRAPHY  07/05/2012   mild-mod LA dilation,mild mitral insuff,mild AI    Current Medications: Outpatient Medications Prior to Visit  Medication Sig Dispense Refill   alendronate (FOSAMAX) 70 MG tablet Take 70 mg by mouth once a week. Take with a full glass of water on an empty stomach.     cabergoline (DOSTINEX) 0.5 MG tablet Take 0.25 mg by mouth 2 (two) times a week.     CALCIUM & MAGNESIUM CARBONATES PO Take 1,000 mg by mouth daily.     Cholecalciferol (VITAMIN D3) 1000 units CAPS Take by mouth daily in the afternoon.     ezetimibe (ZETIA) 10 MG tablet Take 10 mg by mouth daily.     Multiple Vitamins-Minerals (MULTIVITAMIN WITH MINERALS) tablet Take 1 tablet by mouth daily.     rosuvastatin (CRESTOR) 20 MG tablet Take 20 mg by mouth daily.  3   sildenafil (REVATIO) 20 MG tablet as needed.     hydrocortisone butyrate (LUCOID) 0.1 % CREA cream Apply topically 2 (two) times daily as needed. (Patient not taking: Reported on 11/20/2023)     Meclizine HCl 25 MG CHEW Chew 25 mg by mouth as  needed. (Patient not taking: Reported on 11/20/2023)     tazarotene (TAZORAC) 0.05 % cream Apply topically as needed. (Patient not taking: Reported on 11/20/2023)     No facility-administered medications prior to visit.     Allergies:   Sulfamethoxazole and Sulfonamide derivatives   Family History:  The patient's family history includes Heart failure in his mother; Hypertension in his mother; Leukemia in his maternal grandfather; Stroke in his father, maternal grandmother, and mother.   ROS:   Please see the history of present illness.    ROS  All other systems are reviewed and are negative.  PHYSICAL EXAM:   VS:  BP 110/62  (BP Location: Left Arm, Patient Position: Sitting, Cuff Size: Normal)   Pulse 74   Ht 5' 11.5" (1.816 m)   Wt 172 lb 3.2 oz (78.1 kg)   SpO2 95%   BMI 23.68 kg/m      General: Alert, oriented x3, no distress, appears very fit, younger than stated age Head: no evidence of trauma, PERRL, EOMI, no exophtalmos or lid lag, no myxedema, no xanthelasma; normal ears, nose and oropharynx Neck: normal jugular venous pulsations and no hepatojugular reflux; brisk carotid pulses without delay and no carotid bruits Chest: clear to auscultation, no signs of consolidation by percussion or palpation, normal fremitus, symmetrical and full respiratory excursions Cardiovascular: normal position and quality of the apical impulse, regular rhythm, normal first and second heart sounds, no murmurs, rubs or gallops Abdomen: no tenderness or distention, no masses by palpation, no abnormal pulsatility or arterial bruits, normal bowel sounds, no hepatosplenomegaly Extremities: no clubbing, cyanosis or edema; 2+ radial, ulnar and brachial pulses bilaterally; 2+ right femoral, posterior tibial and dorsalis pedis pulses; 2+ left femoral, posterior tibial and dorsalis pedis pulses; no subclavian or femoral bruits Neurological: grossly nonfocal Psych: Normal mood and affect    Wt Readings from Last 3 Encounters:  11/20/23 172 lb 3.2 oz (78.1 kg)  11/05/22 170 lb 6.4 oz (77.3 kg)  03/13/22 170 lb 12.8 oz (77.5 kg)      Studies/Labs Reviewed:   EKG:    EKG Interpretation Date/Time:  Friday November 20 2023 09:16:07 EDT Ventricular Rate:  74 PR Interval:  180 QRS Duration:  82 QT Interval:  398 QTC Calculation: 441 R Axis:   -26  Text Interpretation: Normal sinus rhythm Inferior infarct , age undetermined When compared with ECG of 19-Feb-2005 07:14, No significant change since last tracing Confirmed by Lorinda Copland [id number]) on 11/20/2023 9:22:45 AM        Recent Labs:/ Dr. Waynard Edwards labs from Oct 2020 Total  cholesterol 157, HDL 49, LDL 83, triglycerides 74 Creatinine 0.9, hemoglobin 15.1, normal liver function tests  06/20/2020 Total cholesterol 144, HDL 44, LDL 82, triglycerides 88, creatinine 0.9, potassium 4.3  08/04/2022 Cholesterol 134, HDL 48, LDL 64, triglycerides 100  Potassium 4.2, TSH 2.04, ALT 19  10/21/2023 Cholesterol 104, HDL 41, LDL 52, triglycerides 56 Potassium 4.3, ALT 19, TSH 2.18  Lipid Panel    Component Value Date/Time   CHOL 231 (H) 07/31/2014 1735   TRIG 233 (H) 07/31/2014 1735   HDL 46 07/31/2014 1735   CHOLHDL 5.0 07/31/2014 1735   VLDL 47 (H) 07/31/2014 1735   LDLCALC 138 (H) 07/31/2014 1735    ASSESSMENT:    1. PVCs (premature ventricular contractions)   2. Hypercholesterolemia       PLAN:  In order of problems listed above:   PVCs: Minimally symptomatic and in the absence of any structural  heart disease these do not require treatment. HLP: His coronary calcium score is very much average.  Excellent LDL on recent lipid profile, continue rosuvastatin.  We discussed the fact that at this point it is unlikely we will be making any major changes to her treatment or exploring testing.  If she wants he can follow-up on a "as needed" basis, but of course I will be delighted to see him back in the office at any time for any new developments.  Medication Adjustments/Labs and Tests Ordered: Current medicines are reviewed at length with the patient today.  Concerns regarding medicines are outlined above.  Medication changes, Labs and Tests ordered today are listed in the Patient Instructions below. Patient Instructions  Medication Instructions:  Your physician recommends that you continue on your current medications as directed. Please refer to the Current Medication list given to you today.  *If you need a refill on your cardiac medications before your next appointment, please call your pharmacy*   Lab Work: None ordered  If you have any lab test  that is abnormal or we need to change your treatment, we will call you to review the results.   Testing/Procedures: None ordered   Follow-Up: At [facility], you and your health needs are our priority.  As part of our continuing mission to provide you with exceptional heart care, we have created designated Provider Care Teams.  These Care Teams include your primary Cardiologist (physician) and Advanced Practice Providers (APPs -  Physician Assistants and Nurse Practitioners) who all work together to provide you with the care you need, when you need it.  Your next appointment:   as  needed  The format for your next appointment:   In Person  Provider:   Thurmon Fair, MD {   Thank you for choosing [facility]!!   [phone]  Other Instructions    1st Floor: - Lobby - Registration  - Pharmacy  - Lab - Cafe  2nd Floor: - PV Lab - Diagnostic Testing (echo, CT, nuclear med)  3rd Floor: - Vacant  4th Floor: - TCTS (cardiothoracic surgery) - AFib Clinic - Structural Heart Clinic - Vascular Surgery  - Vascular Ultrasound  5th Floor: - [facility]Care Cardiology (general and EP) - Clinical Pharmacy for coumadin, hypertension, lipid, weight-loss medications, and med management appointments    Valet parking services will be available as well.             Signed, Thurmon Fair, MD  11/21/2023 2:17 PM    [facility] [address] [place], [place], [place]  [address] Phone: [phone]; Fax: [phone]

## 2023-11-26 DIAGNOSIS — R82998 Other abnormal findings in urine: Secondary | ICD-10-CM | POA: Diagnosis not present

## 2024-02-04 DIAGNOSIS — D485 Neoplasm of uncertain behavior of skin: Secondary | ICD-10-CM | POA: Diagnosis not present

## 2024-02-04 DIAGNOSIS — L812 Freckles: Secondary | ICD-10-CM | POA: Diagnosis not present

## 2024-02-04 DIAGNOSIS — D2272 Melanocytic nevi of left lower limb, including hip: Secondary | ICD-10-CM | POA: Diagnosis not present

## 2024-02-04 DIAGNOSIS — L570 Actinic keratosis: Secondary | ICD-10-CM | POA: Diagnosis not present

## 2024-02-04 DIAGNOSIS — L738 Other specified follicular disorders: Secondary | ICD-10-CM | POA: Diagnosis not present

## 2024-02-04 DIAGNOSIS — D2271 Melanocytic nevi of right lower limb, including hip: Secondary | ICD-10-CM | POA: Diagnosis not present

## 2024-02-04 DIAGNOSIS — L820 Inflamed seborrheic keratosis: Secondary | ICD-10-CM | POA: Diagnosis not present

## 2024-02-04 DIAGNOSIS — L821 Other seborrheic keratosis: Secondary | ICD-10-CM | POA: Diagnosis not present

## 2024-02-04 DIAGNOSIS — D0439 Carcinoma in situ of skin of other parts of face: Secondary | ICD-10-CM | POA: Diagnosis not present

## 2024-02-12 DIAGNOSIS — R0981 Nasal congestion: Secondary | ICD-10-CM | POA: Diagnosis not present

## 2024-02-12 DIAGNOSIS — J069 Acute upper respiratory infection, unspecified: Secondary | ICD-10-CM | POA: Diagnosis not present

## 2024-02-12 DIAGNOSIS — J479 Bronchiectasis, uncomplicated: Secondary | ICD-10-CM | POA: Diagnosis not present

## 2024-02-12 DIAGNOSIS — R059 Cough, unspecified: Secondary | ICD-10-CM | POA: Diagnosis not present

## 2024-02-12 DIAGNOSIS — J470 Bronchiectasis with acute lower respiratory infection: Secondary | ICD-10-CM | POA: Diagnosis not present

## 2024-02-12 DIAGNOSIS — J029 Acute pharyngitis, unspecified: Secondary | ICD-10-CM | POA: Diagnosis not present

## 2024-02-12 DIAGNOSIS — Z1152 Encounter for screening for COVID-19: Secondary | ICD-10-CM | POA: Diagnosis not present

## 2024-02-12 DIAGNOSIS — R5383 Other fatigue: Secondary | ICD-10-CM | POA: Diagnosis not present

## 2024-03-09 DIAGNOSIS — Z85828 Personal history of other malignant neoplasm of skin: Secondary | ICD-10-CM | POA: Diagnosis not present

## 2024-03-09 DIAGNOSIS — C44329 Squamous cell carcinoma of skin of other parts of face: Secondary | ICD-10-CM | POA: Diagnosis not present

## 2024-06-20 DIAGNOSIS — D485 Neoplasm of uncertain behavior of skin: Secondary | ICD-10-CM | POA: Diagnosis not present

## 2024-06-20 DIAGNOSIS — Z85828 Personal history of other malignant neoplasm of skin: Secondary | ICD-10-CM | POA: Diagnosis not present

## 2024-06-20 DIAGNOSIS — C44321 Squamous cell carcinoma of skin of nose: Secondary | ICD-10-CM | POA: Diagnosis not present

## 2024-06-24 DIAGNOSIS — M25551 Pain in right hip: Secondary | ICD-10-CM | POA: Diagnosis not present

## 2024-06-24 DIAGNOSIS — M810 Age-related osteoporosis without current pathological fracture: Secondary | ICD-10-CM | POA: Diagnosis not present

## 2024-06-24 DIAGNOSIS — G629 Polyneuropathy, unspecified: Secondary | ICD-10-CM | POA: Diagnosis not present

## 2024-06-24 DIAGNOSIS — M5136 Other intervertebral disc degeneration, lumbar region with discogenic back pain only: Secondary | ICD-10-CM | POA: Diagnosis not present

## 2024-07-05 DIAGNOSIS — M5417 Radiculopathy, lumbosacral region: Secondary | ICD-10-CM | POA: Diagnosis not present

## 2024-07-05 DIAGNOSIS — M25551 Pain in right hip: Secondary | ICD-10-CM | POA: Diagnosis not present

## 2024-07-14 DIAGNOSIS — M5417 Radiculopathy, lumbosacral region: Secondary | ICD-10-CM | POA: Diagnosis not present

## 2024-07-14 DIAGNOSIS — M25551 Pain in right hip: Secondary | ICD-10-CM | POA: Diagnosis not present

## 2024-07-14 DIAGNOSIS — N401 Enlarged prostate with lower urinary tract symptoms: Secondary | ICD-10-CM | POA: Diagnosis not present

## 2024-07-14 DIAGNOSIS — N5201 Erectile dysfunction due to arterial insufficiency: Secondary | ICD-10-CM | POA: Diagnosis not present

## 2024-07-14 DIAGNOSIS — R399 Unspecified symptoms and signs involving the genitourinary system: Secondary | ICD-10-CM | POA: Diagnosis not present

## 2024-07-14 DIAGNOSIS — R351 Nocturia: Secondary | ICD-10-CM | POA: Diagnosis not present

## 2024-07-20 DIAGNOSIS — M25551 Pain in right hip: Secondary | ICD-10-CM | POA: Diagnosis not present

## 2024-07-20 DIAGNOSIS — M5417 Radiculopathy, lumbosacral region: Secondary | ICD-10-CM | POA: Diagnosis not present

## 2024-07-26 DIAGNOSIS — M25551 Pain in right hip: Secondary | ICD-10-CM | POA: Diagnosis not present

## 2024-07-26 DIAGNOSIS — M5417 Radiculopathy, lumbosacral region: Secondary | ICD-10-CM | POA: Diagnosis not present

## 2024-08-01 DIAGNOSIS — Z85828 Personal history of other malignant neoplasm of skin: Secondary | ICD-10-CM | POA: Diagnosis not present

## 2024-08-01 DIAGNOSIS — C44321 Squamous cell carcinoma of skin of nose: Secondary | ICD-10-CM | POA: Diagnosis not present

## 2024-08-05 DIAGNOSIS — M5417 Radiculopathy, lumbosacral region: Secondary | ICD-10-CM | POA: Diagnosis not present

## 2024-08-05 DIAGNOSIS — M25551 Pain in right hip: Secondary | ICD-10-CM | POA: Diagnosis not present
